# Patient Record
Sex: Male | Born: 1980
Health system: Southern US, Community
[De-identification: ages and names within clinical notes are randomized; demographics above are authoritative.]

## PROBLEM LIST (undated history)

## (undated) DIAGNOSIS — M8580 Other specified disorders of bone density and structure, unspecified site: Secondary | ICD-10-CM

## (undated) DIAGNOSIS — R569 Unspecified convulsions: Secondary | ICD-10-CM

## (undated) DIAGNOSIS — N200 Calculus of kidney: Secondary | ICD-10-CM

## (undated) DIAGNOSIS — F419 Anxiety disorder, unspecified: Secondary | ICD-10-CM

## (undated) HISTORY — DX: Anxiety disorder, unspecified: F41.9

## (undated) HISTORY — DX: Calculus of kidney: N20.0

## (undated) HISTORY — DX: Other specified disorders of bone density and structure, unspecified site: M85.80

## (undated) HISTORY — DX: Unspecified convulsions: R56.9

## (undated) HISTORY — PX: OTHER SURGICAL HISTORY: SHX169

---

## 2001-03-21 ENCOUNTER — Emergency Department (HOSPITAL_COMMUNITY): Admission: EM | Admit: 2001-03-21 | Discharge: 2001-03-21 | Payer: Self-pay | Admitting: Emergency Medicine

## 2001-03-21 ENCOUNTER — Encounter: Payer: Self-pay | Admitting: Emergency Medicine

## 2003-08-28 ENCOUNTER — Emergency Department (HOSPITAL_COMMUNITY): Admission: EM | Admit: 2003-08-28 | Discharge: 2003-08-28 | Payer: Self-pay | Admitting: Emergency Medicine

## 2004-08-09 ENCOUNTER — Emergency Department (HOSPITAL_COMMUNITY): Admission: EM | Admit: 2004-08-09 | Discharge: 2004-08-09 | Payer: Self-pay | Admitting: Emergency Medicine

## 2004-10-30 ENCOUNTER — Encounter: Admission: RE | Admit: 2004-10-30 | Discharge: 2004-10-30 | Payer: Self-pay | Admitting: Neurology
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
## 2010-05-20 ENCOUNTER — Other Ambulatory Visit: Payer: Self-pay | Admitting: Neurology

## 2010-05-20 DIAGNOSIS — Z79899 Other long term (current) drug therapy: Secondary | ICD-10-CM

## 2010-06-16 ENCOUNTER — Other Ambulatory Visit: Payer: Self-pay

## 2012-09-04 ENCOUNTER — Telehealth: Payer: Self-pay | Admitting: Nurse Practitioner

## 2012-09-04 NOTE — Telephone Encounter (Signed)
Spoke with pt to schedule with Dr Hosie Poisson but pt said that Glenn Rosario mentioned to him on his last office visit that she, "had a specific doctor that she was going to refer him to." I am routing this to [employer].

## 2012-11-12 DIAGNOSIS — Z0289 Encounter for other administrative examinations: Secondary | ICD-10-CM

## 2012-11-21 ENCOUNTER — Telehealth: Payer: Self-pay

## 2012-11-21 NOTE — Telephone Encounter (Signed)
I called and notified patient that his [employer] forms have been faxed.

## 2012-12-19 ENCOUNTER — Encounter: Payer: Self-pay | Admitting: Nurse Practitioner

## 2012-12-22 ENCOUNTER — Encounter: Payer: Self-pay | Admitting: Nurse Practitioner

## 2012-12-22 ENCOUNTER — Ambulatory Visit (INDEPENDENT_AMBULATORY_CARE_PROVIDER_SITE_OTHER): Payer: 59 | Admitting: Nurse Practitioner

## 2012-12-22 VITALS — BP 126/86 | HR 78 | Ht 71.0 in | Wt 220.0 lb

## 2012-12-22 DIAGNOSIS — G40309 Generalized idiopathic epilepsy and epileptic syndromes, not intractable, without status epilepticus: Secondary | ICD-10-CM

## 2012-12-22 DIAGNOSIS — Z79899 Other long term (current) drug therapy: Secondary | ICD-10-CM

## 2012-12-22 DIAGNOSIS — G250 Essential tremor: Secondary | ICD-10-CM | POA: Insufficient documentation

## 2012-12-22 MED ORDER — ZONISAMIDE 100 MG PO CAPS: 100.0000 mg | ORAL_CAPSULE | Freq: Two times a day (BID) | ORAL | Status: DC

## 2012-12-22 MED ORDER — LEVETIRACETAM 750 MG PO TABS: ORAL_TABLET | ORAL | Status: DC

## 2012-12-22 NOTE — Patient Instructions (Addendum)
Patient to be assigned to Dr. Hosie Poisson Obtain  CBC and CMP today Biofeedback or other stress reduction activities Melatonin to help with sleep Follow up in 6 months

## 2012-12-22 NOTE — Progress Notes (Signed)
Reason for visit followup for seizure disorder. HPI: [name], 32 year old male returns for followup. He is a previous patient of Dr. Sandria Rosario.  He was last seen 04/21/12. He has a history of primary generalized and myoclonic epilepsy. His seizure disorder began in 1995 and he did well  for many years. At one point he was on Klonopin for increased seizures. DEXA scan done in 2006 showed evidence of osteopenia, he also had difficulty with weight gain and daytime sleepiness and tremor. Keppra added in 2007 and he was tapered off  of Depakote in November and December of 2007. Until January of 2009 he had not had any generalized major motor seizures while being on Keppra. He returns today stating that his seizure activity is fairly well controlled with current meds. Keppra  750 TID and  Zonegran 400mg  daily.   Denies any double vision, or loss of vision, occasionally has ringing in his ears, denies any speech or swallowing problems.  He continues to exercise.    Has tried Klonipin, Dilantin and Tegretol in past with side effects.   2 fractures of the 5th metatarsal of the same foot in 2010  playing  basketball.  Stress and lack of sleep can cause a seizure. No new neurologic complaints.      ROS:  Anxiety, decreased energy, change in appetite, insomnia, seizure  Medications Current Outpatient Prescriptions on File Prior to Visit  Medication Sig Dispense Refill  . Calcium Ascorbate (C-500 NON-ACID) 500 MG TABS Take 500 mg by mouth 3 (three) times daily.      . Cholecalciferol (D 400 PO) Take 400 mg by mouth daily.      Marland Kitchen levETIRAcetam (KEPPRA) 750 MG tablet Take 750 mg by mouth 3 (three) times daily. 1 tab in the morning and 2 tabs at night.      . zonisamide (ZONEGRAN) 100 MG capsule Take 100 mg by mouth 4 (four) times daily.       No current facility-administered medications on file prior to visit.    Allergies  Allergies  Allergen Reactions  . Depakote [Divalproex Sodium]   . Klonopin [Clonazepam]    . Tegretol [Carbamazepine]     Physical Exam General: well developed, well nourished, seated, in no evident distress Head: head normocephalic and atraumatic. Oropharynx benign Neck: supple with no carotid  bruits Cardiovascular: regular rate and rhythm, no murmurs  Neurologic Exam Mental Status: Awake and fully alert. Oriented to place and time. Follows all commands. Speech and language normal.   Cranial Nerves: Fundoscopic exam reveals sharp disc margins. Pupils equal, briskly reactive to light. Extraocular movements full without nystagmus. Visual fields full to confrontation. Hearing intact and symmetric to finger snap. Facial sensation intact. Face, tongue, palate move normally and symmetrically. Neck flexion and extension normal.  Motor: Normal bulk and tone. Normal strength in all tested extremity muscles.No focal weakness Sensory.: intact to touch and pinprick and vibratory.  Coordination: Rapid alternating movements normal in all extremities. Finger-to-nose and heel-to-shin performed accurately bilaterally. Gait and Station: Arises from chair without difficulty. Stance is normal. Gait demonstrates normal stride length and balance . Able to heel, toe and tandem walk without difficulty.  Reflexes: 2+ and symmetric. Toes downgoing.     ASSESSMENT: History of epilepsy and myoclonus doing fairly well on Keppra and Zonegran. Occasional seizure when stressed or lack of sleep. Denies missing any doses of meds     PLAN: Patient to be assigned to Dr. Hosie Poisson Obtain  CBC and CMP today  Biofeedback or other stress reduction activities Melatonin to help with sleep Patient does not want to increase meds or add additional  meds Follow up in 6 months  Glenn Rosario, GNP-BC APRN

## 2012-12-23 ENCOUNTER — Telehealth: Payer: Self-pay

## 2012-12-23 LAB — CBC WITH DIFFERENTIAL/PLATELET
Basophils Absolute: 0 10*3/uL (ref 0.0–0.2)
Basos: 0 % (ref 0–3)
Eos: 3 % (ref 0–5)
Eosinophils Absolute: 0.1 10*3/uL (ref 0.0–0.4)
HCT: 47.8 % (ref 37.5–51.0)
Hemoglobin: 16.8 g/dL (ref 12.6–17.7)
Lymphocytes Absolute: 1.2 10*3/uL (ref 0.7–3.1)
Lymphs: 22 % (ref 14–46)
MCH: 31.3 pg (ref 26.6–33.0)
MCHC: 35.1 g/dL (ref 31.5–35.7)
MCV: 89 fL (ref 79–97)
Monocytes Absolute: 0.5 10*3/uL (ref 0.1–0.9)
Monocytes: 9 % (ref 4–12)
Neutrophils Absolute: 3.5 10*3/uL (ref 1.4–7.0)
Neutrophils Relative %: 66 % (ref 40–74)
RBC: 5.37 x10E6/uL (ref 4.14–5.80)
RDW: 13.4 % (ref 12.3–15.4)
WBC: 5.3 10*3/uL (ref 3.4–10.8)

## 2012-12-23 LAB — COMPREHENSIVE METABOLIC PANEL
Albumin: 4.6 g/dL (ref 3.5–5.5)
Alkaline Phosphatase: 67 IU/L (ref 39–117)
BUN/Creatinine Ratio: 9 (ref 8–19)
BUN: 12 mg/dL (ref 6–20)
Chloride: 103 mmol/L (ref 96–108)
Creatinine, Ser: 1.27 mg/dL (ref 0.76–1.27)
GFR calc Af Amer: 85 mL/min/{1.73_m2} (ref >59)
GFR calc non Af Amer: 73 mL/min/{1.73_m2} (ref >59)
Globulin, Total: 2.5 g/dL (ref 1.5–4.5)
Glucose: 102 mg/dL — ABNORMAL HIGH (ref 65–99)
Total Bilirubin: 0.9 mg/dL (ref 0.0–1.2)
Total Protein: 7.1 g/dL (ref 6.0–8.5)

## 2012-12-23 NOTE — Telephone Encounter (Signed)
Message copied by Dan Humphreys on Tue Dec 23, 2012 12:52 PM ------      Message from: Beverely Low      Created: Tue Dec 23, 2012 10:06 AM       Labs look good. Please call patient            ----- Message -----         From: [facility] Lab Results In Interface         Sent: 12/23/2012   5:44 AM           To: Nilda Riggs, NP                   ------

## 2012-12-23 NOTE — Telephone Encounter (Signed)
Called and left message labs looked good.

## 2012-12-26 ENCOUNTER — Other Ambulatory Visit: Payer: Self-pay

## 2012-12-26 MED ORDER — ZONISAMIDE 100 MG PO CAPS: 200.0000 mg | ORAL_CAPSULE | Freq: Two times a day (BID) | ORAL | Status: DC

## 2013-05-15 DIAGNOSIS — Z0289 Encounter for other administrative examinations: Secondary | ICD-10-CM

## 2013-05-22 ENCOUNTER — Telehealth: Payer: Self-pay

## 2013-05-22 NOTE — Telephone Encounter (Signed)
I called and left VM for patient that forms have been faxed out today.

## 2013-06-24 ENCOUNTER — Ambulatory Visit: Payer: 59 | Admitting: Nurse Practitioner

## 2013-09-07 ENCOUNTER — Encounter: Payer: Self-pay | Admitting: Neurology

## 2013-09-07 ENCOUNTER — Telehealth: Payer: Self-pay | Admitting: Neurology

## 2013-09-07 NOTE — Telephone Encounter (Signed)
Left message for patient regarding rescheduling 10/16/13 appointment per Carolyn's schedule. Printed and sent letter with new appointment date.

## 2013-10-16 ENCOUNTER — Ambulatory Visit: Payer: Self-pay | Admitting: Nurse Practitioner

## 2013-11-18 DIAGNOSIS — Z0289 Encounter for other administrative examinations: Secondary | ICD-10-CM

## 2013-12-21 ENCOUNTER — Encounter: Payer: Self-pay | Admitting: Nurse Practitioner

## 2013-12-21 ENCOUNTER — Ambulatory Visit (INDEPENDENT_AMBULATORY_CARE_PROVIDER_SITE_OTHER): Payer: 59 | Admitting: Nurse Practitioner

## 2013-12-21 VITALS — BP 136/90 | HR 66 | Ht 71.0 in | Wt 226.6 lb

## 2013-12-21 DIAGNOSIS — Z5181 Encounter for therapeutic drug level monitoring: Secondary | ICD-10-CM

## 2013-12-21 DIAGNOSIS — G40309 Generalized idiopathic epilepsy and epileptic syndromes, not intractable, without status epilepticus: Secondary | ICD-10-CM

## 2013-12-21 DIAGNOSIS — G250 Essential tremor: Secondary | ICD-10-CM

## 2013-12-21 DIAGNOSIS — G252 Other specified forms of tremor: Secondary | ICD-10-CM

## 2013-12-21 LAB — COMPREHENSIVE METABOLIC PANEL
ALBUMIN: 4.7 g/dL (ref 3.5–5.5)
ALT: 43 IU/L (ref 0–44)
AST: 20 IU/L (ref 0–40)
Albumin/Globulin Ratio: 1.8 (ref 1.1–2.5)
Alkaline Phosphatase: 83 IU/L (ref 39–117)
BUN / CREAT RATIO: 11 (ref 8–19)
BUN: 15 mg/dL (ref 6–20)
CALCIUM: 9.4 mg/dL (ref 8.7–10.2)
CO2: 24 mmol/L (ref 18–29)
CREATININE: 1.39 mg/dL — AB (ref 0.76–1.27)
Chloride: 102 mmol/L (ref 96–108)
GFR calc Af Amer: 75 mL/min/{1.73_m2} (ref >59)
GFR calc non Af Amer: 65 mL/min/{1.73_m2} (ref >59)
GLOBULIN, TOTAL: 2.6 g/dL (ref 1.5–4.5)
GLUCOSE: 90 mg/dL (ref 65–99)
Potassium: 4.3 mmol/L (ref 3.5–5.2)
Sodium: 140 mmol/L (ref 134–144)
TOTAL PROTEIN: 7.3 g/dL (ref 6.0–8.5)
Total Bilirubin: 0.7 mg/dL (ref 0.0–1.2)

## 2013-12-21 LAB — CBC WITH DIFFERENTIAL
BASOS: 1 %
Basophils Absolute: 0 10*3/uL (ref 0.0–0.2)
EOS ABS: 0.2 10*3/uL (ref 0.0–0.4)
Eos: 3 %
HCT: 46.6 % (ref 37.5–51.0)
HEMOGLOBIN: 16.6 g/dL (ref 12.6–17.7)
LYMPHS: 26 %
Lymphocytes Absolute: 1.7 10*3/uL (ref 0.7–3.1)
MCH: 31.6 pg (ref 26.6–33.0)
MCHC: 35.6 g/dL (ref 31.5–35.7)
MCV: 89 fL (ref 79–97)
Monocytes Absolute: 0.6 10*3/uL (ref 0.1–0.9)
Monocytes: 9 %
NEUTROS PCT: 61 %
Neutrophils Absolute: 4.1 10*3/uL (ref 1.4–7.0)
PLATELETS: 195 10*3/uL (ref 150–379)
RBC: 5.26 x10E6/uL (ref 4.14–5.80)
RDW: 13.6 % (ref 12.3–15.4)
WBC: 6.6 10*3/uL (ref 3.4–10.8)

## 2013-12-21 MED ORDER — ZONISAMIDE 100 MG PO CAPS: 200.0000 mg | ORAL_CAPSULE | Freq: Two times a day (BID) | ORAL | Status: DC

## 2013-12-21 MED ORDER — LEVETIRACETAM 750 MG PO TABS: ORAL_TABLET | ORAL | Status: DC

## 2013-12-21 NOTE — Progress Notes (Signed)
[facility]  PATIENT: Glenn Rosario DOB: 1980/09/21   REASON FOR VISIT: Followup for seizure disorder   HISTORY OF PRESENT ILLNESS:Glenn Rosario, 33 year old male returns for followup. He is history of primary generalized  myoclonic epilepsy. His seizure disorder began in 1995. He is currently on Keppra 750 one tab in the morning and 2 tabs at night along with Zonegran 200 mg twice daily. He has some occasional muscle twitching. He denies any double vision loss of vision speech or swallowing problems. He continues to be very active. Stress and lack of sleep can cause seizure. He returns for reevaluation  HISTORY: Glenn. Rosario, 33 year old male returns for followup. He is a previous patient of Dr. Sandria Manly. He was last seen 04/21/12. He has a history of primary generalized and myoclonic epilepsy. His seizure disorder began in 1995 and he did well for many years. At one point he was on Klonopin for increased seizures. DEXA scan done in 2006 showed evidence of osteopenia, he also had difficulty with weight gain and daytime sleepiness and tremor. Keppra added in 2007 and he was tapered off of Depakote in November and December of 2007. Until January of 2009 he had not had any generalized major motor seizures while being on Keppra. He returns today stating that his seizure activity is fairly well controlled with current meds. Keppra 750 TID and Zonegran  daily. Denies any double vision, or loss of vision, occasionally has ringing in his ears, denies any speech or swallowing problems. He continues to exercise. Has tried Klonipin, Dilantin and Tegretol in past with side effects. 2 fractures of the 5th metatarsal of the same foot in 2010 playing basketball. Stress and lack of sleep can cause a seizure. No new neurologic complaints.    REVIEW OF SYSTEMS: Full 14 system review of systems performed and notable only for those listed, all others are neg:  Constitutional: N/A  Cardiovascular: N/A    Ear/Nose/Throat: N/A  Skin: N/A  Eyes: N/A  Respiratory: N/A  Gastroitestinal: N/A  Hematology/Lymphatic: N/A  Endocrine: N/A Musculoskeletal:N/A  Allergy/Immunology: N/A  Neurological: Seizure disorder  Psychiatric: N/A Sleep : NA   ALLERGIES: Allergies  Allergen Reactions  . Depakote [Divalproex Sodium]   . Klonopin [Clonazepam]   . Tegretol [Carbamazepine]     HOME MEDICATIONS: Outpatient Prescriptions Prior to Visit  Medication Sig Dispense Refill  . Calcium Ascorbate (C-500 NON-ACID) 500 MG TABS Take 500 mg by mouth 3 (three) times daily.      . Cholecalciferol (D 400 PO) Take 400 mg by mouth daily.      Marland Kitchen levETIRAcetam (KEPPRA) 750 MG tablet 1 tab in the morning and 2 tabs at night.  270 tablet  1  . zonisamide (ZONEGRAN) 100 MG capsule Take 2 capsules (200 mg total) by mouth 2 (two) times daily.  360 capsule  1   No facility-administered medications prior to visit.    PAST MEDICAL HISTORY: Past Medical History  Diagnosis Date  . Seizure   . Osteopenia     PAST SURGICAL HISTORY: Past Surgical History  Procedure Laterality Date  . None      FAMILY HISTORY: Family History  Problem Relation Age of Onset  . Healthy Mother   . Healthy Father     SOCIAL HISTORY: History   Social History  . Marital Status: Single    Spouse Name: N/A    Number of Children: 0  . Years of Education: 12+   Occupational History  .  [employer]  Social History Main Topics  . Smoking status: Never Smoker   . Smokeless tobacco: Never Used  . Alcohol Use: Yes     Comment: seldon  . Drug Use: No  . Sexual Activity: Not on file   Other Topics Concern  . Not on file   Social History Narrative   Patient is single and lives alone.    Patient works as an Airline pilot for 8 years.    Patient drinks some caffeine.            PHYSICAL EXAM  Filed Vitals:   12/21/13 0907  BP: 136/90  Pulse: 66  Height:  (1.803 m)  Weight: 226 lb 9.6 oz (102.785 kg)    Body mass index is 31.62 kg/(m^2). General: well developed, well nourished, seated, in no evident distress  Head: head normocephalic and atraumatic. Oropharynx benign  Neck: supple with no carotid bruits  Cardiovascular: regular rate and rhythm, no murmurs  Neurologic Exam  Mental Status: Awake and fully alert. Oriented to place and time. Follows all commands. Speech and language normal.  Cranial Nerves:  Pupils equal, briskly reactive to light. Extraocular movements full without nystagmus. Visual fields full to confrontation. Hearing intact and symmetric to finger snap. Facial sensation intact. Face, tongue, palate move normally and symmetrically. Neck flexion and extension normal.  Motor: Normal bulk and tone. Normal strength in all tested extremity muscles.No focal weakness  Sensory.: intact to touch and pinprick and vibratory.  Coordination: Rapid alternating movements normal in all extremities. Finger-to-nose and heel-to-shin performed accurately bilaterally.  Gait and Station: Arises from chair without difficulty. Stance is normal. Gait demonstrates normal stride length and balance . Able to heel, toe and tandem walk without difficulty.  Reflexes: 2+ and symmetric. Toes downgoing.   DIAGNOSTIC DATA (LABS, IMAGING, TESTING) - I reviewed patient records, labs, notes, testing and imaging myself where available.  Lab Results  Component Value Date   WBC 5.3 12/22/2012   HGB 16.8 12/22/2012   HCT 47.8 12/22/2012   MCV 89 12/22/2012      Component Value Date/Time   NA 139 12/22/2012 0918   K 4.1 12/22/2012 0918   CL 103 12/22/2012 0918   CO2 24 12/22/2012 0918   GLUCOSE 102* 12/22/2012 0918   BUN 12 12/22/2012 0918   CREATININE 1.27 12/22/2012 0918   CALCIUM 9.9 12/22/2012 0918   PROT 7.1 12/22/2012 0918   AST 21 12/22/2012 0918   ALT 41 12/22/2012 0918   ALKPHOS 67 12/22/2012 0918   BILITOT 0.9 12/22/2012 0918   GFRNONAA 73 12/22/2012 0918   GFRAA 85 12/22/2012 0918    ASSESSMENT AND  PLAN  33 y.o. year old male  has a past medical history of of primary generalized  myoclonic epilepsy and Osteopenia. here to followup. Patient has had a few episodes of myoclonus, he is not kept  a  record. Currently compliant on Keppra and Zonegran.  Continue Keppra 750 mg one tab in the morning and 2 at night will refill Continue Zonegran 100 mg, 2 tablets twice daily Labs today Patient to keep a record of his myoclonus events call me within a few weeks. Discussed with Dr. Hosie Poisson and wll increase his Keppra once he calls back Followup yearly and when necessary Nilda Riggs, [facility], [facility], APRN  [facility] [address] [place], [place] [address] [phone]

## 2013-12-21 NOTE — Patient Instructions (Signed)
Continue Keppra 750 mg one tab in the morning and 2 at night will refill Continue Zonegran 100 mg, 2 tablets twice daily Labs today Followup yearly and when necessary

## 2013-12-22 ENCOUNTER — Telehealth: Payer: Self-pay | Admitting: Nurse Practitioner

## 2013-12-22 MED ORDER — LEVETIRACETAM 750 MG PO TABS: 1500.0000 mg | ORAL_TABLET | Freq: Two times a day (BID) | ORAL | Status: DC

## 2013-12-22 NOTE — Telephone Encounter (Signed)
TC to patient at listed work number, number is no longer in service. TC to home number, discussed labs ,  let patient know I want to increase his keppra to 2 tabs twice daily. He is agreeable. Will call mail order to change RX from yesterday.

## 2013-12-22 NOTE — Addendum Note (Signed)
Addended by: Lucille Passy C on: 12/22/2013 03:10 PM   Modules accepted: Orders

## 2013-12-22 NOTE — Telephone Encounter (Signed)
I called [facility].  Spoke with pharmacist Renae Fickle.  Advised Rx dose has changed.  He updated Keppra to  two in am and two in pm.  #360  + 3 refills.

## 2014-11-15 ENCOUNTER — Telehealth: Payer: Self-pay | Admitting: *Deleted

## 2014-11-15 NOTE — Telephone Encounter (Signed)
Form,[employer] sent to Redan[place] and Eber JonesCarolyn 11/15/14.

## 2014-11-16 DIAGNOSIS — Z0289 Encounter for other administrative examinations: Secondary | ICD-10-CM

## 2014-11-16 NOTE — Telephone Encounter (Signed)
Form completed and signed.  To MR. 

## 2014-11-17 NOTE — Telephone Encounter (Signed)
Form,[employer] received,completed by Fernande Brasarolyn and Sandy faxed 11/16/14.

## 2014-11-23 ENCOUNTER — Telehealth: Payer: Self-pay | Admitting: Nurse Practitioner

## 2014-11-23 NOTE — Telephone Encounter (Signed)
Pt called and states that his FMLA paper work needs more information ,per his boss. Needs to know if FMLA paper work is still here. Please call and advise [phone].

## 2014-11-24 ENCOUNTER — Telehealth: Payer: Self-pay | Admitting: *Deleted

## 2014-11-24 NOTE — Telephone Encounter (Signed)
I called pt and will call and speak to Glenn Rosario with [employer], [phone] to see what they need specifically.

## 2014-11-24 NOTE — Telephone Encounter (Signed)
Form went to [facility] 11/24/14.

## 2014-11-25 NOTE — Telephone Encounter (Signed)
I called and spoke to [place], at [employer].  She said for the FMLA to be considered intermittent, the questions # 1 and 6 need to be at least seen 2 times/ yearly.  I will consult with CM/NP and then will call pt back.

## 2014-11-25 NOTE — Telephone Encounter (Signed)
Changes made on FMLA.  To be faxed back to [employer].  Pt aware.  No need to have copy for himself states pt.

## 2014-11-25 NOTE — Telephone Encounter (Signed)
Faxed [phone].  (successful).

## 2014-12-22 ENCOUNTER — Ambulatory Visit: Payer: 59 | Admitting: Nurse Practitioner

## 2014-12-29 ENCOUNTER — Encounter: Payer: Self-pay | Admitting: Nurse Practitioner

## 2014-12-29 ENCOUNTER — Ambulatory Visit (INDEPENDENT_AMBULATORY_CARE_PROVIDER_SITE_OTHER): Payer: 59 | Admitting: Nurse Practitioner

## 2014-12-29 VITALS — BP 130/85 | HR 82 | Ht 71.0 in | Wt 233.2 lb

## 2014-12-29 DIAGNOSIS — G40309 Generalized idiopathic epilepsy and epileptic syndromes, not intractable, without status epilepticus: Secondary | ICD-10-CM | POA: Diagnosis not present

## 2014-12-29 DIAGNOSIS — Z5181 Encounter for therapeutic drug level monitoring: Secondary | ICD-10-CM | POA: Diagnosis not present

## 2014-12-29 DIAGNOSIS — R251 Tremor, unspecified: Secondary | ICD-10-CM | POA: Diagnosis not present

## 2014-12-29 DIAGNOSIS — G250 Essential tremor: Secondary | ICD-10-CM

## 2014-12-29 DIAGNOSIS — G252 Other specified forms of tremor: Secondary | ICD-10-CM

## 2014-12-29 MED ORDER — ZONISAMIDE 100 MG PO CAPS: 200.0000 mg | ORAL_CAPSULE | Freq: Two times a day (BID) | ORAL | Status: DC

## 2014-12-29 MED ORDER — LEVETIRACETAM 750 MG PO TABS: 1500.0000 mg | ORAL_TABLET | Freq: Two times a day (BID) | ORAL | Status: DC

## 2014-12-29 NOTE — Progress Notes (Addendum)
[facility]  PATIENT: Glenn Rosario DOB: [birth year]   REASON FOR VISIT: Follow-up for primary generalized myoclonic epilepsy HISTORY FROM: Patient    HISTORY OF PRESENT ILLNESS:Glenn Rosario, 34 year old male returns for followup. He has a  history of primary generalized myoclonic epilepsy. His seizure disorder began in 1995. He is currently on Keppra 750 two tabs twice daily and Zonegran 200 mg twice daily. He has some rare  muscle twitching. He denies any double vision loss of vision speech or swallowing problems. He continues to be very active. Stress and lack of sleep can cause seizure. He returns for reevaluation. He is a previous patient of Dr. Hosie Poisson who has left the practice  HISTORY: Glenn Rosario, 34 year old male returns for followup. He has a history of primary generalized and myoclonic epilepsy. His seizure disorder began in 1995 and he did well for many years. At one point he was on Klonopin for increased seizures. DEXA scan done in 2006 showed evidence of osteopenia, he also had difficulty with weight gain and daytime sleepiness and tremor. Keppra added in 2007 and he was tapered off of Depakote in November and December of 2007. Until January of 2009 he had not had any generalized major motor seizures while being on Keppra. He returns today stating that his seizure activity is fairly well controlled with current meds. Keppra 750 TID and Zonegran  daily. Denies any double vision, or loss of vision, occasionally has ringing in his ears, denies any speech or swallowing problems. He continues to exercise. Has tried Klonipin, Dilantin and Tegretol in past with side effects. 2 fractures of the 5th metatarsal of the same foot in 2010 playing basketball. Stress and lack of sleep can cause a seizure. No new neurologic complaints.    REVIEW OF SYSTEMS: Full 14 system review of systems performed and notable only for those listed, all others are neg:  Constitutional: neg    Cardiovascular: neg Ear/Nose/Throat: neg  Skin: neg Eyes: neg Respiratory: neg Gastroitestinal: neg  Hematology/Lymphatic: neg  Endocrine: neg Musculoskeletal:neg Allergy/Immunology: neg Neurological: neg Psychiatric: neg Sleep : neg   ALLERGIES: Allergies  Allergen Reactions  . Depakote [Divalproex Sodium]   . Klonopin [Clonazepam]   . Tegretol [Carbamazepine]     HOME MEDICATIONS: Outpatient Prescriptions Prior to Visit  Medication Sig Dispense Refill  . Calcium Ascorbate (C-500 NON-ACID) 500 MG TABS Take 500 mg by mouth 3 (three) times daily.    . Cholecalciferol (D 400 PO) Take 400 mg by mouth daily.    Marland Kitchen levETIRAcetam (KEPPRA) 750 MG tablet Take 2 tablets (1,500 mg total) by mouth 2 (two) times daily. 360 tablet 3  . zonisamide (ZONEGRAN) 100 MG capsule Take 2 capsules (200 mg total) by mouth 2 (two) times daily. 360 capsule 3   No facility-administered medications prior to visit.    PAST MEDICAL HISTORY: Past Medical History  Diagnosis Date  . Seizure   . Osteopenia     PAST SURGICAL HISTORY: Past Surgical History  Procedure Laterality Date  . None      FAMILY HISTORY: Family History  Problem Relation Age of Onset  . Healthy Mother   . Healthy Father     SOCIAL HISTORY: Social History   Social History  . Marital Status: Single    Spouse Name: N/A  . Number of Children: 0  . Years of Education: 12+   Occupational History  .  [employer]   Social History Main Topics  . Smoking status: Never Smoker   .  Smokeless tobacco: Never Used  . Alcohol Use: Yes     Comment: seldon  . Drug Use: No  . Sexual Activity: Not on file   Other Topics Concern  . Not on file   Social History Narrative   Patient is single and lives alone.    Patient works as an Airline pilot for 8 years.    Patient drinks some caffeine.            PHYSICAL EXAM  Filed Vitals:   12/29/14 1539  BP: 130/85  Pulse: 82  Height: 5\' 11"  (1.803 m)  Weight: 233 lb 3.2  oz (105.779 kg)   Body mass index is 32.54 kg/(m^2). General: well developed, well nourished, seated, in no evident distress  Head: head normocephalic and atraumatic. Oropharynx benign  Neck: supple with no carotid bruits  Cardiovascular: regular rate and rhythm, no murmurs  Neurologic Exam  Mental Status: Awake and fully alert. Oriented to place and time. Follows all commands. Speech and language normal.  Cranial Nerves: Pupils equal, briskly reactive to light. Extraocular movements full without nystagmus. Visual fields full to confrontation. Hearing intact and symmetric to finger snap. Facial sensation intact. Face, tongue, palate move normally and symmetrically. Neck flexion and extension normal.  Motor: Normal bulk and tone. Normal strength in all tested extremity muscles.No focal weakness  Sensory.: intact to touch and pinprick and vibratory.  Coordination: Rapid alternating movements normal in all extremities. Finger-to-nose and heel-to-shin performed accurately bilaterally.  Gait and Station: Arises from chair without difficulty. Stance is normal. Gait demonstrates normal stride length and balance . Able to heel, toe and tandem walk without difficulty.  Reflexes: 2+ and symmetric. Toes downgoing.   DIAGNOSTIC DATA (LABS, IMAGING, TESTING) -  ASSESSMENT AND PLAN 34 y.o. year old male has a past medical history of of primary generalized myoclonic epilepsy and Osteopenia. here to followup. Patient has rare episodes of myoclonus, he is not kept a record. Currently compliant on Keppra and Zonegran. The patient is a previous patient of Dr. Hosie Poisson. He will be assigned to Dr. Lucia Gaskins.  Continue Keppra 750 mg 2 tabs twice daily will renew Continue Zonegran 100 mg, 2 tablets twice daily will renew Labs today, CBC, CMP Follow-up in one year Call for any seizure activity Nilda Riggs, [facility], [facility], APRN  [facility] [address], [address] [place],  [place] [address] [phone]   Personally  participated in and made any corrections needed to history, physical, neuro exam,assessment and plan as stated above.  Naomie Dean, MD Stroke Neurology [phone] [facility]

## 2014-12-29 NOTE — Patient Instructions (Addendum)
Continue Keppra 750 mg 2 tabs BID Continue Zonegran 100 mg, 2 tablets twice daily Labs today, CBC, CMP F/U  In 1 year

## 2014-12-30 ENCOUNTER — Telehealth: Payer: Self-pay

## 2014-12-30 LAB — COMPREHENSIVE METABOLIC PANEL
ALK PHOS: 84 IU/L (ref 39–117)
ALT: 38 IU/L (ref 0–44)
AST: 23 IU/L (ref 0–40)
Albumin/Globulin Ratio: 1.6 (ref 1.1–2.5)
Albumin: 4.6 g/dL (ref 3.5–5.5)
BUN/Creatinine Ratio: 12 (ref 8–19)
BUN: 14 mg/dL (ref 6–20)
Bilirubin Total: 0.6 mg/dL (ref 0.0–1.2)
CALCIUM: 9.7 mg/dL (ref 8.7–10.2)
CO2: 20 mmol/L (ref 18–29)
CREATININE: 1.17 mg/dL (ref 0.76–1.27)
Chloride: 104 mmol/L (ref 97–108)
GFR calc Af Amer: 92 mL/min/{1.73_m2} (ref >59)
GFR, EST NON AFRICAN AMERICAN: 80 mL/min/{1.73_m2} (ref >59)
GLOBULIN, TOTAL: 2.8 g/dL (ref 1.5–4.5)
GLUCOSE: 96 mg/dL (ref 65–99)
POTASSIUM: 4 mmol/L (ref 3.5–5.2)
SODIUM: 141 mmol/L (ref 134–144)
Total Protein: 7.4 g/dL (ref 6.0–8.5)

## 2014-12-30 LAB — CBC WITH DIFFERENTIAL/PLATELET
BASOS: 1 %
Basophils Absolute: 0.1 10*3/uL (ref 0.0–0.2)
EOS (ABSOLUTE): 0.2 10*3/uL (ref 0.0–0.4)
Eos: 3 %
Hematocrit: 48.7 % (ref 37.5–51.0)
Hemoglobin: 16.9 g/dL (ref 12.6–17.7)
IMMATURE GRANS (ABS): 0 10*3/uL (ref 0.0–0.1)
IMMATURE GRANULOCYTES: 0 %
LYMPHS: 25 %
Lymphocytes Absolute: 2.1 10*3/uL (ref 0.7–3.1)
MCH: 31.4 pg (ref 26.6–33.0)
MCHC: 34.7 g/dL (ref 31.5–35.7)
MCV: 90 fL (ref 79–97)
MONOS ABS: 0.8 10*3/uL (ref 0.1–0.9)
Monocytes: 9 %
NEUTROS PCT: 62 %
Neutrophils Absolute: 5.4 10*3/uL (ref 1.4–7.0)
PLATELETS: 171 10*3/uL (ref 150–379)
RBC: 5.39 x10E6/uL (ref 4.14–5.80)
RDW: 13.4 % (ref 12.3–15.4)
WBC: 8.5 10*3/uL (ref 3.4–10.8)

## 2014-12-30 NOTE — Telephone Encounter (Signed)
-----   Message from Nilda Riggs, NP sent at 12/30/2014  7:37 AM EDT ----- Labs good please call

## 2014-12-30 NOTE — Telephone Encounter (Signed)
I called the patient and relayed results. 

## 2015-11-08 DIAGNOSIS — Z0289 Encounter for other administrative examinations: Secondary | ICD-10-CM

## 2015-11-09 ENCOUNTER — Telehealth: Payer: Self-pay | Admitting: *Deleted

## 2015-11-09 NOTE — Telephone Encounter (Signed)
Megan-do you want to document what you spoke w/ pt about while in office?

## 2015-11-09 NOTE — Telephone Encounter (Signed)
The patient came to the office today I spoke to the patient. Explained that we would like to examine the patient before submitting FMLA paperwork in case there has been any changes. Patient reports that his paperwork is due July 26 but appointment was scheduled for August 31. We have changed the patient's appointment to Monday, July 17 at 8 AM [facility] Patient is amenable to this plan. Patient's next follow-up will need to occur in June 2018 in order to ensure that his office visit occurs prior to the due date for his FMLA.

## 2015-11-09 NOTE — Telephone Encounter (Signed)
Tried calling pt back. Phone continued to ring, unable to LVM. It pt calls, you can offer earlier appt

## 2015-11-09 NOTE — Telephone Encounter (Signed)
noted 

## 2015-11-09 NOTE — Telephone Encounter (Signed)
I have openings all next visit. Please offer patient earlier appt.

## 2015-11-09 NOTE — Telephone Encounter (Signed)
Eber JonesCarolyn- please advise, are you ok with sending FMLA prior to f/u?   Called pt to advise we did receive FMLA paperwork. I spoke to CM, NP this am and when he comes for f/u on 12/29/15, we will then go ahead and send paperwork. He stated "if that's the case, you guys needs to schedule an earlier f/u than 1 yr so you guys can get this paperwork done in a timely manner. I am not responsible for when this paperwork gets done. I paid 50 dollars for this form and you can't even get it done in a timely manner". I advised pt that Eber JonesCarolyn needs to see him first and do repeat bloodwork, ect prior to sending paperwork.  I advised patient that it typically is our policy for him to pay for form when form is completed. He stated "I went ahead and paid so form could be completed". Per form that [place] Mi[place] E. Took from patient, "patient wanted to go ahead and pay, so he already pain".  I advised I will speak to CM, NP and see if we are able to send prior to f/u on 12/29/15. I advised I was only trying to help and keep him informed. I will call him back and let him know what she says. He did not reply, I said goodbye, and the phone hung up.

## 2015-11-14 ENCOUNTER — Ambulatory Visit (INDEPENDENT_AMBULATORY_CARE_PROVIDER_SITE_OTHER): Payer: 59 | Admitting: Nurse Practitioner

## 2015-11-14 ENCOUNTER — Encounter: Payer: Self-pay | Admitting: Nurse Practitioner

## 2015-11-14 VITALS — BP 124/81 | HR 78 | Ht 71.0 in | Wt 241.6 lb

## 2015-11-14 DIAGNOSIS — Z5181 Encounter for therapeutic drug level monitoring: Secondary | ICD-10-CM | POA: Diagnosis not present

## 2015-11-14 DIAGNOSIS — G250 Essential tremor: Secondary | ICD-10-CM

## 2015-11-14 DIAGNOSIS — G40309 Generalized idiopathic epilepsy and epileptic syndromes, not intractable, without status epilepticus: Secondary | ICD-10-CM

## 2015-11-14 MED ORDER — LEVETIRACETAM 750 MG PO TABS: 1500.0000 mg | ORAL_TABLET | Freq: Two times a day (BID) | ORAL | Status: DC

## 2015-11-14 MED ORDER — ZONISAMIDE 100 MG PO CAPS: 200.0000 mg | ORAL_CAPSULE | Freq: Two times a day (BID) | ORAL | Status: DC

## 2015-11-14 NOTE — Progress Notes (Signed)
I agree with the assessment and plan as directed by NP .The patient is known to me .   Maxie Slovacek, MD  

## 2015-11-14 NOTE — Telephone Encounter (Signed)
FMLA completed, reviewed and signed by CM/NP.  To MR.

## 2015-11-14 NOTE — Progress Notes (Signed)
[facility]  PATIENT: Glenn Rosario DOB: [birth year]   REASON FOR VISIT: Follow-up for generalized convulsive epilepsy, essential tremor HISTORY FROM: Patient    HISTORY OF PRESENT ILLNESS:Glenn Rosario, 35 year old male returns for followup. He has a history of primary generalized myoclonic epilepsy. His seizure disorder began in 1995. He is currently on Keppra 750 two tabs twice daily and Zonegran 200 mg twice daily. He has some rare muscle twitching. He denies any double vision, loss of vision speech or swallowing problems. He continues to be very active. Stress and lack of sleep can cause seizure. He returns for reevaluation.   HISTORY: Glenn Rosario, 35 year old male returns for followup. He has a history of primary generalized and myoclonic epilepsy. His seizure disorder began in 1995 and he did well for many years. At one point he was on Klonopin for increased seizures. DEXA scan done in 2006 showed evidence of osteopenia, he also had difficulty with weight gain and daytime sleepiness and tremor. Keppra added in 2007 and he was tapered off of Depakote in November and December of 2007. Until January of 2009 he had not had any generalized major motor seizures while being on Keppra. He returns today stating that his seizure activity is fairly well controlled with current meds. Keppra 750 TID and Zonegran  daily. Denies any double vision, or loss of vision, occasionally has ringing in his ears, denies any speech or swallowing problems. He continues to exercise. Has tried Klonipin, Dilantin and Tegretol in past with side effects. 2 fractures of the 5th metatarsal of the same foot in 2010 playing basketball. Stress and lack of sleep can cause a seizure. No new neurologic complaints.    REVIEW OF SYSTEMS: Full 14 system review of systems performed and notable only for those listed, all others are neg:  Constitutional: neg  Cardiovascular: neg Ear/Nose/Throat: neg  Skin:  neg Eyes: neg Respiratory: neg Gastroitestinal: neg  Hematology/Lymphatic: neg  Endocrine: neg Musculoskeletal:neg Allergy/Immunology: neg Neurological: neg Psychiatric: neg Sleep : neg   ALLERGIES: Allergies  Allergen Reactions  . Depakote [Divalproex Sodium]   . Klonopin [Clonazepam]   . Tegretol [Carbamazepine]     HOME MEDICATIONS: Outpatient Prescriptions Prior to Visit  Medication Sig Dispense Refill  . Calcium Ascorbate (C-500 NON-ACID) 500 MG TABS Take 500 mg by mouth 3 (three) times daily.    . Cholecalciferol (D 400 PO) Take 400 mg by mouth daily.    Marland Kitchen levETIRAcetam (KEPPRA) 750 MG tablet Take 2 tablets (1,500 mg total) by mouth 2 (two) times daily. 360 tablet 3  . zonisamide (ZONEGRAN) 100 MG capsule Take 2 capsules (200 mg total) by mouth 2 (two) times daily. 360 capsule 3   No facility-administered medications prior to visit.    PAST MEDICAL HISTORY: Past Medical History  Diagnosis Date  . Seizure (HCC)   . Osteopenia     PAST SURGICAL HISTORY: Past Surgical History  Procedure Laterality Date  . None      FAMILY HISTORY: Family History  Problem Relation Age of Onset  . Healthy Mother   . Healthy Father   . Pulmonary Hypertension Mother     SOCIAL HISTORY: Social History   Social History  . Marital Status: Single    Spouse Name: N/A  . Number of Children: 0  . Years of Education: 12+   Occupational History  .  [employer]   Social History Main Topics  . Smoking status: Never Smoker   . Smokeless tobacco: Never Used  .  Alcohol Use: Yes     Comment: seldon  . Drug Use: No  . Sexual Activity: Not on file   Other Topics Concern  . Not on file   Social History Narrative   Patient is single and lives alone.    Patient works as an Airline pilotAccountant for 8 years.    Patient drinks some caffeine.            PHYSICAL EXAM  Filed Vitals:   11/14/15 0755  BP: 124/81  Pulse: 78  Height: 5\' 11"  (1.803 m)  Weight: 241 lb 9.6 oz (109.589  kg)   Body mass index is 33.71 kg/(m^2). General: well developed, well nourished, seated, in no evident distress  Head: head normocephalic and atraumatic. Oropharynx benign  Neck: supple with no carotid bruits  Cardiovascular: regular rate and rhythm, no murmurs  Neurologic Exam  Mental Status: Awake and fully alert. Oriented to place and time. Follows all commands. Speech and language normal.  Cranial Nerves: Pupils equal, briskly reactive to light. Extraocular movements full without nystagmus. Visual fields full to confrontation. Hearing intact and symmetric to finger snap. Facial sensation intact. Face, tongue, palate move normally and symmetrically. Neck flexion and extension normal.  Motor: Normal bulk and tone. Normal strength in all tested extremity muscles.No focal weakness  Coordination: Rapid alternating movements normal in all extremities. Finger-to-nose and heel-to-shin performed accurately bilaterally.  Gait and Station: Arises from chair without difficulty. Stance is normal. Gait demonstrates normal stride length and balance . Able to heel, toe and tandem walk without difficulty.  Reflexes: 2+ and symmetric. Toes downgoing.   DIAGNOSTIC DATA (LABS, IMAGING, TESTING) -  ASSESSMENT AND PLAN 35 y.o. year old male has a past medical history of of primary generalized myoclonic epilepsy and Osteopenia. here to followup. Patient has rare episodes of myoclonus, he is not kept a record. Currently compliant on Keppra and Zonegran. Has failed Klonipin, Dilantin and Tegretol in past with side effects.The patient is a  patient of Dr. Lucia GaskinsAhern who is out of the office.   Continue Keppra 750 mg 2 tabs twice daily will renew Continue Zonegran 100 mg, 2 tablets twice daily will renew Labs today, CBC, CMP Follow-up in one year Call for any seizure activity Nilda RiggsNancy Carolyn Martin, [facility] Hospital[facility]NP, [facility], APRN  [facility] [address], [address] [place], [place] [address] [phone]  [phone]

## 2015-11-14 NOTE — Patient Instructions (Signed)
Continue Keppra 750 mg 2 tabs twice daily will renew Continue Zonegran 100 mg, 2 tablets twice daily will renew Labs today, CBC, CMP Follow-up in one year June of next year Call for any seizure activity

## 2015-11-15 ENCOUNTER — Telehealth: Payer: Self-pay | Admitting: *Deleted

## 2015-11-15 LAB — COMPREHENSIVE METABOLIC PANEL
ALBUMIN: 4.3 g/dL (ref 3.5–5.5)
ALK PHOS: 88 IU/L (ref 39–117)
ALT: 37 IU/L (ref 0–44)
AST: 19 IU/L (ref 0–40)
Albumin/Globulin Ratio: 1.5 (ref 1.2–2.2)
BILIRUBIN TOTAL: 0.6 mg/dL (ref 0.0–1.2)
BUN / CREAT RATIO: 8 — AB (ref 9–20)
BUN: 11 mg/dL (ref 6–20)
CHLORIDE: 103 mmol/L (ref 96–106)
CO2: 20 mmol/L (ref 18–29)
Calcium: 9.3 mg/dL (ref 8.7–10.2)
Creatinine, Ser: 1.46 mg/dL — ABNORMAL HIGH (ref 0.76–1.27)
GFR calc Af Amer: 71 mL/min/{1.73_m2} (ref >59)
GFR calc non Af Amer: 61 mL/min/{1.73_m2} (ref >59)
GLOBULIN, TOTAL: 2.8 g/dL (ref 1.5–4.5)
GLUCOSE: 98 mg/dL (ref 65–99)
Potassium: 4.1 mmol/L (ref 3.5–5.2)
SODIUM: 140 mmol/L (ref 134–144)
Total Protein: 7.1 g/dL (ref 6.0–8.5)

## 2015-11-15 LAB — CBC WITH DIFFERENTIAL/PLATELET
BASOS ABS: 0 10*3/uL (ref 0.0–0.2)
Basos: 1 %
EOS (ABSOLUTE): 0.2 10*3/uL (ref 0.0–0.4)
Eos: 4 %
HEMATOCRIT: 48 % (ref 37.5–51.0)
HEMOGLOBIN: 16.5 g/dL (ref 12.6–17.7)
IMMATURE GRANULOCYTES: 1 %
Immature Grans (Abs): 0 10*3/uL (ref 0.0–0.1)
LYMPHS ABS: 1.9 10*3/uL (ref 0.7–3.1)
Lymphs: 29 %
MCH: 31 pg (ref 26.6–33.0)
MCHC: 34.4 g/dL (ref 31.5–35.7)
MCV: 90 fL (ref 79–97)
MONOS ABS: 0.6 10*3/uL (ref 0.1–0.9)
Monocytes: 10 %
NEUTROS PCT: 55 %
Neutrophils Absolute: 3.6 10*3/uL (ref 1.4–7.0)
Platelets: 187 10*3/uL (ref 150–379)
RBC: 5.32 x10E6/uL (ref 4.14–5.80)
RDW: 13.5 % (ref 12.3–15.4)
WBC: 6.3 10*3/uL (ref 3.4–10.8)

## 2015-11-15 NOTE — Telephone Encounter (Signed)
-----   Message from Nilda RiggsNancy Carolyn Martin, NP sent at 11/15/2015  8:00 AM EDT ----- Cr  elevated otherwise normal.

## 2015-11-15 NOTE — Telephone Encounter (Signed)
Spoke to pt and relayed lab results.  Cr elevated otherwise normal.  No other labs needed.  He will contact us if wants for his pcp.

## 2015-11-16 ENCOUNTER — Telehealth: Payer: Self-pay | Admitting: *Deleted

## 2015-11-16 NOTE — Telephone Encounter (Signed)
[facility]16/2015.

## 2015-12-29 ENCOUNTER — Ambulatory Visit: Payer: 59 | Admitting: Nurse Practitioner

## 2016-10-15 ENCOUNTER — Ambulatory Visit (INDEPENDENT_AMBULATORY_CARE_PROVIDER_SITE_OTHER): Payer: 59 | Admitting: Nurse Practitioner

## 2016-10-15 ENCOUNTER — Encounter: Payer: Self-pay | Admitting: Nurse Practitioner

## 2016-10-15 VITALS — BP 134/84 | HR 76 | Ht 71.0 in | Wt 232.6 lb

## 2016-10-15 DIAGNOSIS — G40309 Generalized idiopathic epilepsy and epileptic syndromes, not intractable, without status epilepticus: Secondary | ICD-10-CM

## 2016-10-15 DIAGNOSIS — Z5181 Encounter for therapeutic drug level monitoring: Secondary | ICD-10-CM

## 2016-10-15 DIAGNOSIS — G250 Essential tremor: Secondary | ICD-10-CM | POA: Diagnosis not present

## 2016-10-15 MED ORDER — ZONISAMIDE 100 MG PO CAPS: 200.0000 mg | ORAL_CAPSULE | Freq: Two times a day (BID) | ORAL | 3 refills | Status: DC

## 2016-10-15 MED ORDER — LEVETIRACETAM 750 MG PO TABS: 1500.0000 mg | ORAL_TABLET | Freq: Two times a day (BID) | ORAL | 3 refills | Status: DC

## 2016-10-15 NOTE — Patient Instructions (Signed)
Continue Keppra 750 mg 2 tabs twice daily will renew Continue Zonegran 100 mg, 2 tablets twice daily will renew Labs today, CBC, CMP Follow-up in one year Call for any seizure activity

## 2016-10-15 NOTE — Progress Notes (Signed)
[facility]  PATIENT: Glenn Rosario DOB: [birth year]   REASON FOR VISIT: Follow-up for generalized convulsive epilepsy, essential tremor HISTORY FROM: Patient    HISTORY OF PRESENT ILLNESS:UPDATE  10/15/2016 Glenn Rosario, 36 year old male returns for yearly followup. He has a history of primary generalized myoclonic epilepsy. His seizure disorder began in 1995. He is currently on Keppra 750 two tabs twice daily and Zonegran 200 mg twice daily. He has some rare muscle twitching. He denies any double vision, loss of vision speech or swallowing problems. He continues to be very active. Stress and lack of sleep can cause seizure.Has tried Klonipin, Dilantin and Tegretol in past with side effects He returns for reevaluation.   HISTORY: Glenn. Denny Rosario, 36 year old male returns for followup. He has a history of primary generalized and myoclonic epilepsy. His seizure disorder began in 1995 and he did well for many years. At one point he was on Klonopin for increased seizures. DEXA scan done in 2006 showed evidence of osteopenia, he also had difficulty with weight gain and daytime sleepiness and tremor. Keppra added in 2007 and he was tapered off of Depakote in November and December of 2007. Until January of 2009 he had not had any generalized major motor seizures while being on Keppra. He returns today stating that his seizure activity is fairly well controlled with current meds. Keppra 750 TID and Zonegran 400mg  daily. Denies any double vision, or loss of vision, occasionally has ringing in his ears, denies any speech or swallowing problems. He continues to exercise. Has tried Klonipin, Dilantin and Tegretol in past with side effects. 2 fractures of the 5th metatarsal of the same foot in 2010 playing basketball. Stress and lack of sleep can cause a seizure. No new neurologic complaints.    REVIEW OF SYSTEMS: Full 14 system review of systems performed and notable only for those listed, all  others are neg:  Constitutional: neg  Cardiovascular: neg Ear/Nose/Throat: neg  Skin: neg Eyes: neg Respiratory: neg Gastroitestinal: neg  Hematology/Lymphatic: neg  Endocrine: neg Musculoskeletal:neg Allergy/Immunology: neg Neurological: neg Psychiatric: neg Sleep : neg   ALLERGIES: Allergies  Allergen Reactions  . Depakote [Divalproex Sodium]   . Klonopin [Clonazepam]   . Tegretol [Carbamazepine]     HOME MEDICATIONS: Outpatient Medications Prior to Visit  Medication Sig Dispense Refill  . Calcium Ascorbate (C-500 NON-ACID) 500 MG TABS Take 500 mg by mouth 3 (three) times daily.    . Cholecalciferol (D 400 PO) Take 400 mg by mouth daily.    Marland Kitchen. levETIRAcetam (KEPPRA) 750 MG tablet Take 2 tablets (1,500 mg total) by mouth 2 (two) times daily. 360 tablet 3  . zonisamide (ZONEGRAN) 100 MG capsule Take 2 capsules (200 mg total) by mouth 2 (two) times daily. 360 capsule 3   No facility-administered medications prior to visit.     PAST MEDICAL HISTORY: Past Medical History:  Diagnosis Date  . Osteopenia   . Seizure (HCC)     PAST SURGICAL HISTORY: Past Surgical History:  Procedure Laterality Date  . None      FAMILY HISTORY: Family History  Problem Relation Age of Onset  . Healthy Mother   . Pulmonary Hypertension Mother   . Healthy Father     SOCIAL HISTORY: Social History   Social History  . Marital status: Single    Spouse name: N/A  . Number of children: 0  . Years of education: 12+   Occupational History  .  [employer]   Social History Main  Topics  . Smoking status: Never Smoker  . Smokeless tobacco: Never Used  . Alcohol use Yes     Comment: seldon  . Drug use: No  . Sexual activity: Not on file   Other Topics Concern  . Not on file   Social History Narrative   Patient is single and lives alone.    Patient works as an Airline pilot for 8 years.    Patient drinks some caffeine.            PHYSICAL EXAM  Vitals:   10/15/16 0806    BP: 134/84  Pulse: 76  Weight: 232 lb 9.6 oz (105.5 kg)  Height: 5\' 11"  (1.803 m)   Body mass index is 32.44 kg/m. General: well developed, well nourished, seated, in no evident distress  Head: head normocephalic and atraumatic. Oropharynx benign  Neck: supple with no carotid bruits  Neurologic Exam  Mental Status: Awake and fully alert. Oriented to place and time. Follows all commands. Speech and language normal.  Cranial Nerves: Pupils equal, briskly reactive to light. Extraocular movements full without nystagmus. Visual fields full to confrontation. Hearing intact and symmetric to finger snap. Facial sensation intact. Face, tongue, palate move normally and symmetrically. Neck flexion and extension normal.  Motor: Normal bulk and tone. Normal strength in all tested extremity muscles.No focal weakness  Coordination: Rapid alternating movements normal in all extremities. Finger-to-nose and heel-to-shin performed accurately bilaterally.  Gait and Station: Arises from chair without difficulty. Stance is normal. Gait demonstrates normal stride length and balance . Able to heel, toe and tandem walk without difficulty.  Reflexes: 2+ and symmetric. Toes downgoing.   DIAGNOSTIC DATA (LABS, IMAGING, TESTING) -  ASSESSMENT AND PLAN 36y.o. year old male has a past medical history of of primary generalized myoclonic epilepsy and Osteopenia. here to followup. Patient has rare episodes of myoclonus, he is not kept a record. Currently compliant on Keppra and Zonegran. Has failed Klonipin, Dilantin and Tegretol in past with side effects.   Continue Keppra 750 mg 2 tabs twice daily will renew Continue Zonegran 100 mg, 2 tablets twice daily will renew Labs today, CBC, CMP to monitor adverse effects seizure medications Follow-up in one year next with Dr. Lucia Gaskins Call for any seizure activity Nilda Riggs, [facility], [facility], APRN  [facility] [address] [place], [place] [address] [phone]

## 2016-10-16 ENCOUNTER — Telehealth: Payer: Self-pay | Admitting: *Deleted

## 2016-10-16 LAB — CBC WITH DIFFERENTIAL/PLATELET
Basophils Absolute: 0 10*3/uL (ref 0.0–0.2)
Basos: 1 %
EOS (ABSOLUTE): 0.1 10*3/uL (ref 0.0–0.4)
Eos: 2 %
Hematocrit: 50.3 % (ref 37.5–51.0)
Hemoglobin: 16.5 g/dL (ref 13.0–17.7)
IMMATURE GRANULOCYTES: 0 %
Immature Grans (Abs): 0 10*3/uL (ref 0.0–0.1)
LYMPHS ABS: 1.4 10*3/uL (ref 0.7–3.1)
Lymphs: 24 %
MCH: 30.6 pg (ref 26.6–33.0)
MCHC: 32.8 g/dL (ref 31.5–35.7)
MCV: 93 fL (ref 79–97)
MONOS ABS: 0.6 10*3/uL (ref 0.1–0.9)
Monocytes: 10 %
NEUTROS PCT: 63 %
Neutrophils Absolute: 3.6 10*3/uL (ref 1.4–7.0)
PLATELETS: 172 10*3/uL (ref 150–379)
RBC: 5.39 x10E6/uL (ref 4.14–5.80)
RDW: 13.2 % (ref 12.3–15.4)
WBC: 5.7 10*3/uL (ref 3.4–10.8)

## 2016-10-16 LAB — COMPREHENSIVE METABOLIC PANEL
A/G RATIO: 2 (ref 1.2–2.2)
ALBUMIN: 4.7 g/dL (ref 3.5–5.5)
ALK PHOS: 73 IU/L (ref 39–117)
ALT: 31 IU/L (ref 0–44)
AST: 19 IU/L (ref 0–40)
BUN / CREAT RATIO: 8 — AB (ref 9–20)
BUN: 11 mg/dL (ref 6–20)
Bilirubin Total: 0.6 mg/dL (ref 0.0–1.2)
CALCIUM: 9.5 mg/dL (ref 8.7–10.2)
CO2: 21 mmol/L (ref 20–29)
Chloride: 103 mmol/L (ref 96–106)
Creatinine, Ser: 1.41 mg/dL — ABNORMAL HIGH (ref 0.76–1.27)
GFR calc Af Amer: 73 mL/min/{1.73_m2} (ref >59)
GFR, EST NON AFRICAN AMERICAN: 63 mL/min/{1.73_m2} (ref >59)
GLOBULIN, TOTAL: 2.3 g/dL (ref 1.5–4.5)
Glucose: 104 mg/dL — ABNORMAL HIGH (ref 65–99)
POTASSIUM: 4.4 mmol/L (ref 3.5–5.2)
SODIUM: 142 mmol/L (ref 134–144)
Total Protein: 7 g/dL (ref 6.0–8.5)

## 2016-10-16 NOTE — Telephone Encounter (Signed)
Attempted to reach patient re: lab results. Phone continuously rang.

## 2016-10-17 NOTE — Telephone Encounter (Signed)
Called pt on home #. NO answer.  Will try later.

## 2016-10-18 ENCOUNTER — Encounter: Payer: Self-pay | Admitting: *Deleted

## 2016-10-18 NOTE — Telephone Encounter (Signed)
Not able to reach pt.  No pcp listed.  I mailed copy of results to pts address and relayed that to f/u with pcp.  He is to call back if questions.

## 2016-11-10 NOTE — Progress Notes (Signed)
Personally  participated in, made any corrections needed, and agree with history, physical, neuro exam,assessment and plan as stated.     Antonia Ahern, MD [facility]     

## 2016-11-19 ENCOUNTER — Telehealth: Payer: Self-pay

## 2016-11-19 NOTE — Telephone Encounter (Signed)
FMLA paperwork completed, reviewed/signed by Darrol Angelarolyn Martin NP allowing pt a reduced schedule and/or a day off 2x/month for seizure/recovery and time for yearly follow-up appts. Returned to medical records for payment/processing.

## 2016-11-21 DIAGNOSIS — Z0289 Encounter for other administrative examinations: Secondary | ICD-10-CM

## 2016-11-23 NOTE — Telephone Encounter (Signed)
Completed and re faxed

## 2016-11-23 NOTE — Telephone Encounter (Signed)
Pt called said [facility] they have rec'd the forms but a change needs to be made: #7 : pls add - as needed Pls refax  Thank you

## 2017-10-21 ENCOUNTER — Ambulatory Visit (INDEPENDENT_AMBULATORY_CARE_PROVIDER_SITE_OTHER): Payer: 59 | Admitting: Neurology

## 2017-10-21 ENCOUNTER — Encounter: Payer: Self-pay | Admitting: Neurology

## 2017-10-21 VITALS — BP 128/84 | HR 88 | Ht 71.0 in | Wt 243.0 lb

## 2017-10-21 DIAGNOSIS — G40B09 Juvenile myoclonic epilepsy, not intractable, without status epilepticus: Secondary | ICD-10-CM

## 2017-10-21 DIAGNOSIS — G40409 Other generalized epilepsy and epileptic syndromes, not intractable, without status epilepticus: Secondary | ICD-10-CM | POA: Diagnosis not present

## 2017-10-21 MED ORDER — ZONISAMIDE 100 MG PO CAPS: 200.0000 mg | ORAL_CAPSULE | Freq: Two times a day (BID) | ORAL | 3 refills | Status: DC

## 2017-10-21 MED ORDER — LEVETIRACETAM 750 MG PO TABS: 1500.0000 mg | ORAL_TABLET | Freq: Two times a day (BID) | ORAL | 3 refills | Status: DC

## 2017-10-21 NOTE — Progress Notes (Signed)
[facility]    Provider:  Dr Lucia Gaskins Referring Provider: No ref. provider found Primary Care Physician:  Patient, No Pcp Per  CC:  Myoclonic epilepsy, JME  HPI:  Glenn Rosario is a 37 y.o. male here as a referral from Dr. No ref. provider found for epilepsy.  I am seeing patient for the first time today.  He has generalized convulsive epilepsy and essential tremor.  Per review of notes he has a history of primary generalized myoclonic epilepsy.  His seizure disorder began in 1995.  He currently takes Keppra 1500 mg twice daily and Zonegran 200 mg twice daily.  He has some rare muscle twitching.  He denies any double vision, loss of vision speech or swallowing problems.  In the past stress and lack of sleep can cause seizures.  Has tried Klonopin, Dilantin and Tegretol in the past with side effects.  DEXA scan done in 2006 showed evidence of osteopenia.  He also had difficulty with weight gain and daytime sleepiness and tremor.  Keppra was added in 2007 and he was tapered off Depakote.  He did not have seizures for 2 years until January 2009 at that time it appears Zonegran was possibly added. No seizures since last being seen. Well controlled on medication. Compliant.   Meds tried: Dilantin, Tegretol, Depakote, Klonopin on Keppra and zonisamide.  Reviewed notes, labs and imaging from outside physicians, which showed: see above  CT head 2002 which was normal by report  Review of Systems: Patient complains of symptoms per HPI as well as the following symptoms: no CP or SOB, no seizures. Pertinent negatives and positives per HPI. All others negative.   Social History   Socioeconomic History  . Marital status: Single    Spouse name: Not on file  . Number of children: 0  . Years of education: 12+  . Highest education level: Not on file  Occupational History    Employer: [employer]  Social Needs  . Financial resource strain: Not on file  . Food insecurity:    Worry: Not  on file    Inability: Not on file  . Transportation needs:    Medical: Not on file    Non-medical: Not on file  Tobacco Use  . Smoking status: Never Smoker  . Smokeless tobacco: Never Used  Substance and Sexual Activity  . Alcohol use: Yes    Alcohol/week: 3.0 oz    Types: 5 Standard drinks or equivalent per week  . Drug use: No  . Sexual activity: Not on file  Lifestyle  . Physical activity:    Days per week: Not on file    Minutes per session: Not on file  . Stress: Not on file  Relationships  . Social connections:    Talks on phone: Not on file    Gets together: Not on file    Attends religious service: Not on file    Active member of club or organization: Not on file    Attends meetings of clubs or organizations: Not on file    Relationship status: Not on file  . Intimate partner violence:    Fear of current or ex partner: Not on file    Emotionally abused: Not on file    Physically abused: Not on file    Forced sexual activity: Not on file  Other Topics Concern  . Not on file  Social History Narrative   Patient is single and lives alone.    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    Patient drinks some caffeine.        Family History  Problem Relation Age of Onset  . Healthy Mother   . Pulmonary Hypertension Mother   . Healthy Father     Past Medical History:  Diagnosis Date  . Osteopenia   . Seizure [facility](HCC)     Past Surgical History:  Procedure Laterality Date  . None      Current Outpatient Medications  Medication Sig Dispense Refill  . Calcium Ascorbate (C-500 NON-ACID) 500 MG TABS Take 500 mg by mouth daily.     . Cholecalciferol (D 400 PO) Take 400 mg by mouth daily.    Marland Kitchen. levETIRAcetam (KEPPRA) 750 MG tablet Take 2 tablets (1,500 mg total) by mouth 2 (two) times daily. 360 tablet 3  . zonisamide (ZONEGRAN) 100 MG capsule Take 2 capsules (200 mg total) by mouth 2 (two) times daily. 360 capsule 3   No current facility-administered  medications for this visit.     Allergies as of 10/21/2017 - Review Complete 10/21/2017  Allergen Reaction Noted  . Depakote [divalproex sodium]  12/19/2012  . Klonopin [clonazepam]  12/19/2012  . Tegretol [carbamazepine]  12/19/2012    Vitals: BP 128/84 (BP Location: Right Arm, Patient Position: Sitting) Comment: automatic  Pulse 88   Ht 5\' 11"  (1.803 m)   Wt 243 lb (110.2 kg)   BMI 33.89 kg/m  Last Weight:  Wt Readings from Last 1 Encounters:  10/21/17 243 lb (110.2 kg)   Last Height:   Ht Readings from Last 1 Encounters:  10/21/17 5\' 11"  (1.803 m)   Physical exam: Exam: Gen: NAD, conversant, well nourised, obese, well groomed                     CV: RRR, no MRG. No Carotid Bruits. No peripheral edema, warm, nontender Eyes: Conjunctivae clear without exudates or hemorrhage  Neuro: Detailed Neurologic Exam  Speech:    Speech is normal; fluent and spontaneous with normal comprehension.  Cognition:    The patient is oriented to person, place, and time;     recent and remote memory intact;     language fluent;     normal attention, concentration,     fund of knowledge Cranial Nerves:    The pupils are equal, round, and reactive to light. The fundi are normal and spontaneous venous pulsations are present. Visual fields are full to finger confrontation. Extraocular movements are intact. Trigeminal sensation is intact and the muscles of mastication are normal. The face is symmetric. The palate elevates in the midline. Hearing intact. Voice is normal. Shoulder shrug is normal. The tongue has normal motion without fasciculations.   Coordination:    Normal finger to nose and heel to shin. Normal rapid alternating movements.   Gait:    Heel-toe and tandem gait are normal.   Motor Observation:    No asymmetry, no atrophy, and no involuntary movements noted. Tone:    Normal muscle tone.    Posture:    Posture is normal. normal erect    Strength:    Strength is V/V in  the upper and lower limbs.      Sensation: intact to LT     Reflex Exam:  DTR's:    Deep tendon reflexes in the upper and lower extremities are normal bilaterally.   Toes:    The toes are downgoing bilaterally.   Clonus:    Clonus is absent.  Assessment/Plan: 37 year old male with a past medical history of primary generalized myoclonic epilepsy and osteopenia.  He is tried multiple medications and is doing well on Keppra and zonisamide.  Continue Keppra 1500 mg twice daily Continue Zonegran 400 mg daily Refill meds We will check labs today Needs follow-up with primary care for repeat DEXA scan  Discussed Patients with epilepsy have a small risk of sudden unexpected death, a condition referred to as sudden unexpected death in epilepsy (SUDEP). SUDEP is defined specifically as the sudden, unexpected, witnessed or unwitnessed, nontraumatic and nondrowning death in patients with epilepsy with or without evidence for a seizure, and excluding documented status epilepticus, in which post mortem examination does not reveal a structural or toxicologic cause for death   Orders Placed This Encounter  Procedures  . CBC  . Comprehensive metabolic panel  . TSH    Naomie Dean, MD  [facility] [address] [place], [place] [address]  Phone [phone] Fax [phone]

## 2017-10-21 NOTE — Patient Instructions (Signed)
Epilepsy °Epilepsy is a condition in which a person has repeated seizures over time. A seizure is a sudden burst of abnormal electrical and chemical activity in the brain. Seizures can cause a change in attention, behavior, or the ability to remain awake and alert (altered mental status). °Epilepsy increases a person's risk of falls, accidents, and injury. It can also lead to complications, including: °· Depression. °· Poor memory. °· Sudden unexplained death in epilepsy (SUDEP). This complication is rare, and its cause is not known. ° °Most people with epilepsy lead normal lives. °What are the causes? °This condition may be caused by: °· A head injury. °· An injury that happens at birth. °· A high fever during childhood. °· A stroke. °· Bleeding that goes into or around the brain. °· Certain medicines and drugs. °· Having too little oxygen for a long period of time. °· Abnormal brain development. °· Certain infections, such as meningitis and encephalitis. °· Brain tumors. °· Conditions that are passed along from parent to child (are hereditary). ° °What are the signs or symptoms? °Symptoms of a seizure vary greatly from person to person. They include: °· Convulsions. °· Stiffening of the body. °· Involuntary movements of the arms or legs. °· Loss of consciousness. °· Breathing problems. °· Falling suddenly. °· Confusion. °· Head nodding. °· Eye blinking or fluttering. °· Lip smacking. °· Drooling. °· Rapid eye movements. °· Grunting. °· Loss of bladder control and bowel control. °· Staring. °· Unresponsiveness. ° °Some people have symptoms right before a seizure happens (aura) and right after a seizure happens. Symptoms of an aura include: °· Fear or anxiety. °· Nausea. °· Feeling like the room is spinning (vertigo). °· A feeling of having seen or heard something before (deja vu). °· Odd tastes or smells. °· Changes in vision, such as seeing flashing lights or spots. ° °Symptoms that follow a seizure  include: °· Confusion. °· Sleepiness. °· Headache. ° °How is this diagnosed? °This condition is diagnosed based on: °· Your symptoms. °· Your medical history. °· A physical exam. °· A neurological exam. A neurological exam is similar to a physical exam. It involves checking your strength, reflexes, coordination, and sensations. °· Tests, such as: °? An electroencephalogram (EEG). This is a painless test that creates a diagram of your brain waves. °? An MRI of the brain. °? A CT scan of the brain. °? A lumbar puncture, also called a spinal tap. °? Blood tests to check for signs of infection or abnormal blood chemistry. ° °How is this treated? °There is no cure for this condition, but treatment can help control seizures. Treatment may involve: °· Taking medicines to control seizures. These include medicines to prevent seizures and medicines to stop seizures as they occur. °· Having a device called a vagus nerve stimulator implanted in the chest. The device sends electrical impulses to the vagus nerve and to the brain to prevent seizures. This treatment may be recommended if medicines do not help. °· Brain surgery. There are several kinds of surgeries that may be done to stop seizures from happening or to reduce how often seizures happen. °· Having regular blood tests. You may need to have blood tests regularly to check that you are getting the right amount of medicine. ° °Once this condition has been diagnosed, it is important to begin treatment as soon as possible. For some people, epilepsy eventually goes away. °Follow these instructions at home: °Medicines ° °· Take over-the-counter and prescription medicines only as   told by your health care provider. °· Avoid any substances that may prevent your medicine from working properly, such as alcohol. °Activity °· Get enough rest. Lack of sleep can make seizures more likely to occur. °· Follow instructions from your health care provider about driving, swimming, and doing  any other activities that would be dangerous if you had a seizure. °Educating others °Teach friends and family what to do if you have a seizure. They should: °· Lay you on the ground to prevent a fall. °· Cushion your head and body. °· Loosen any tight clothing around your neck. °· Turn you on your side. If vomiting occurs, this helps keep your airway clear. °· Stay with you until you recover. °· Not hold you down. Holding you down will not stop the seizure. °· Not put anything in your mouth. °· Know whether or not you need emergency care. ° °General instructions °· Avoid anything that has ever triggered a seizure for you. °· Keep a seizure diary. Record what you remember about each seizure, especially anything that might have triggered the seizure. °· Keep all follow-up visits as told by your health care provider. This is important. °Contact a health care provider if: °· Your seizure pattern changes. °· You have symptoms of infection or another illness. This might increase your risk of having a seizure. °Get help right away if: °· You have a seizure that does not stop after 5 minutes. °· You have several seizures in a row without a complete recovery in between seizures. °· You have a seizure that makes it harder to breathe. °· You have a seizure that is different from previous seizures. °· You have a seizure that leaves you unable to speak or use a part of your body. °· You did not wake up immediately after a seizure. °This information is not intended to replace advice given to you by your health care provider. Make sure you discuss any questions you have with your health care provider. °Document Released: 04/16/2005 Document Revised: 11/12/2015 Document Reviewed: 10/25/2015 °Elsevier Interactive Patient Education © 2018 [employer] ° °

## 2017-10-22 LAB — COMPREHENSIVE METABOLIC PANEL
ALK PHOS: 75 IU/L (ref 39–117)
ALT: 42 IU/L (ref 0–44)
AST: 22 IU/L (ref 0–40)
Albumin/Globulin Ratio: 1.7 (ref 1.2–2.2)
Albumin: 4.4 g/dL (ref 3.5–5.5)
BUN/Creatinine Ratio: 10 (ref 9–20)
BUN: 13 mg/dL (ref 6–20)
Bilirubin Total: 0.6 mg/dL (ref 0.0–1.2)
CALCIUM: 9.5 mg/dL (ref 8.7–10.2)
CO2: 20 mmol/L (ref 20–29)
Chloride: 105 mmol/L (ref 96–106)
Creatinine, Ser: 1.33 mg/dL — ABNORMAL HIGH (ref 0.76–1.27)
GFR calc Af Amer: 78 mL/min/{1.73_m2} (ref >59)
GFR, EST NON AFRICAN AMERICAN: 67 mL/min/{1.73_m2} (ref >59)
GLUCOSE: 90 mg/dL (ref 65–99)
Globulin, Total: 2.6 g/dL (ref 1.5–4.5)
POTASSIUM: 4.2 mmol/L (ref 3.5–5.2)
SODIUM: 140 mmol/L (ref 134–144)
Total Protein: 7 g/dL (ref 6.0–8.5)

## 2017-10-22 LAB — CBC
HEMOGLOBIN: 16.8 g/dL (ref 13.0–17.7)
Hematocrit: 50.9 % (ref 37.5–51.0)
MCH: 30.7 pg (ref 26.6–33.0)
MCHC: 33 g/dL (ref 31.5–35.7)
MCV: 93 fL (ref 79–97)
RBC: 5.47 x10E6/uL (ref 4.14–5.80)
RDW: 13 % (ref 12.3–15.4)
WBC: 6.9 10*3/uL (ref 3.4–10.8)

## 2017-10-22 LAB — TSH: TSH: 1.28 u[IU]/mL (ref 0.450–4.500)

## 2017-10-23 ENCOUNTER — Telehealth: Payer: Self-pay | Admitting: *Deleted

## 2017-10-23 NOTE — Telephone Encounter (Addendum)
Called pt and informed him that his kidney function is abnormal. He verbalized understanding of this and stated he already knew it was slightly elevated. Informed pt that Dr. Lucia GaskinsAhern wants him to see PCP for evaluation and follow up and he verbalized understanding. He had no questions.    ----- Message from Anson FretAntonia B Ahern, MD sent at 10/22/2017  5:54 PM EDT ----- Patient's kidney function is abnormal. He needs to see a primary care physician for evaluation of kidney disease. thanks

## 2017-11-20 ENCOUNTER — Telehealth: Payer: Self-pay | Admitting: Neurology

## 2017-11-20 ENCOUNTER — Telehealth: Payer: Self-pay | Admitting: *Deleted

## 2017-11-20 NOTE — Telephone Encounter (Addendum)
Pt fmla form faxed on 11/21/17 to [employer] [phone]

## 2017-11-20 NOTE — Telephone Encounter (Signed)
Pt FMLA form fax over today. I will call pt for payment.

## 2017-11-20 NOTE — Telephone Encounter (Signed)
Pt called wanting to inform Dr. Lucia GaskinsAhern that her faxed over his yearly FMLA paperwork to medical records. FYI

## 2017-11-21 DIAGNOSIS — Z0289 Encounter for other administrative examinations: Secondary | ICD-10-CM

## 2017-11-21 NOTE — Telephone Encounter (Signed)
Signed.  To MR, Stanton KidneyDebra.

## 2018-10-20 ENCOUNTER — Telehealth: Payer: Self-pay

## 2018-10-20 NOTE — Telephone Encounter (Signed)
Patient has agreed to come and see Amy for a in office visit on 10/22/2018 @ 8:00 am. Patient is aware of new date and time

## 2018-10-22 ENCOUNTER — Ambulatory Visit (INDEPENDENT_AMBULATORY_CARE_PROVIDER_SITE_OTHER): Payer: 59 | Admitting: Family Medicine

## 2018-10-22 ENCOUNTER — Other Ambulatory Visit: Payer: Self-pay

## 2018-10-22 ENCOUNTER — Encounter: Payer: Self-pay | Admitting: Family Medicine

## 2018-10-22 VITALS — BP 125/83 | HR 80 | Temp 97.7°F | Ht 71.0 in | Wt 252.8 lb

## 2018-10-22 DIAGNOSIS — G40309 Generalized idiopathic epilepsy and epileptic syndromes, not intractable, without status epilepticus: Secondary | ICD-10-CM | POA: Diagnosis not present

## 2018-10-22 DIAGNOSIS — G250 Essential tremor: Secondary | ICD-10-CM

## 2018-10-22 NOTE — Progress Notes (Signed)
Made any corrections needed, and agree with history, physical, neuro exam,assessment and plan as stated.     Coretha Creswell, MD [facility]  

## 2018-10-22 NOTE — Progress Notes (Signed)
PATIENT: Glenn Rosario DOB: 1981-01-21  REASON FOR VISIT: follow up HISTORY FROM: patient  Chief Complaint  Patient presents with  . Follow-up    Yearly follow up. Alone. Rm 5. No new concerns at this time.      HISTORY OF PRESENT ILLNESS: Today 10/22/18 Glenn Rosario is a 38 y.o. male here today for follow up for seizure.  He continues levetiracetam 1500 mg twice daily as well as Zonegran 200 mg twice daily.  He is tolerating these medications well and without obvious adverse effects.  He denies any seizure activity.  He reports last seizure was at least 5 years ago.  He does continue to have an essential tremor.  He reports that tremor is much worse during times of stress.    It was noted at his last visit that his creatinine has been elevated for a couple of years.  He was advised to establish care with a primary care provider.  He has not yet done so.  He does admit to regular NSAID use.  He alternates between 800 mg of ibuprofen and 440 mg of Aleve.  He takes 1 or the other on a daily basis for knee pain.  He also drinks diet Dr. Malachi Bonds daily.  There is no known family history of kidney disease.  No known elevated blood pressures.   HISTORY: (copied from Dr Cathren Laine note on 10/21/2017)  HPI:  Glenn Rosario is a 39 y.o. male here as a referral from Dr. No ref. provider found for epilepsy.  I am seeing patient for the first time today.  He has generalized convulsive epilepsy and essential tremor.  Per review of notes he has a history of primary generalized myoclonic epilepsy.  His seizure disorder began in 1995.  He currently takes Keppra 1500 mg twice daily and Zonegran 200 mg twice daily.  He has some rare muscle twitching.  He denies any double vision, loss of vision speech or swallowing problems.  In the past stress and lack of sleep can cause seizures.  Has tried Klonopin, Dilantin and Tegretol in the past with side effects.  DEXA scan done in 2006 showed evidence of osteopenia.  He also  had difficulty with weight gain and daytime sleepiness and tremor.  Keppra was added in 2007 and he was tapered off Depakote.  He did not have seizures for 2 years until January 2009 at that time it appears Zonegran was possibly added. No seizures since last being seen. Well controlled on medication. Compliant.   Meds tried: Dilantin, Tegretol, Depakote, Klonopin on Keppra and zonisamide.  Reviewed notes, labs and imaging from outside physicians, which showed: see above  CT head 2002 which was normal by report   REVIEW OF SYSTEMS: Out of a complete 14 system review of symptoms, the patient complains only of the following symptoms, and all other reviewed systems are negative.  ALLERGIES: Allergies  Allergen Reactions  . Depakote [Divalproex Sodium]   . Klonopin [Clonazepam]   . Tegretol [Carbamazepine]     HOME MEDICATIONS: Outpatient Medications Prior to Visit  Medication Sig Dispense Refill  . Calcium Ascorbate (C-500 NON-ACID) 500 MG TABS Take 500 mg by mouth daily.     . Cholecalciferol (D 400 PO) Take 400 mg by mouth daily.    Marland Kitchen levETIRAcetam (KEPPRA) 750 MG tablet Take 2 tablets (1,500 mg total) by mouth 2 (two) times daily. 360 tablet 3  . zonisamide (ZONEGRAN) 100 MG capsule Take 2 capsules (200 mg total) by mouth  2 (two) times daily. 360 capsule 3   No facility-administered medications prior to visit.     PAST MEDICAL HISTORY: Past Medical History:  Diagnosis Date  . Osteopenia   . Seizure (HCC)     PAST SURGICAL HISTORY: Past Surgical History:  Procedure Laterality Date  . None      FAMILY HISTORY: Family History  Problem Relation Age of Onset  . Healthy Mother   . Pulmonary Hypertension Mother   . Healthy Father     SOCIAL HISTORY: Social History   Socioeconomic History  . Marital status: Single    Spouse name: Not on file  . Number of children: 0  . Years of education: 12+  . Highest education level: Not on file  Occupational History     Employer: [employer]  Social Needs  . Financial resource strain: Not on file  . Food insecurity    Worry: Not on file    Inability: Not on file  . Transportation needs    Medical: Not on file    Non-medical: Not on file  Tobacco Use  . Smoking status: Never Smoker  . Smokeless tobacco: Never Used  Substance and Sexual Activity  . Alcohol use: Yes    Alcohol/week: 5.0 standard drinks    Types: 5 Standard drinks or equivalent per week  . Drug use: No  . Sexual activity: Not on file  Lifestyle  . Physical activity    Days per week: Not on file    Minutes per session: Not on file  . Stress: Not on file  Relationships  . Social Musicianconnections    Talks on phone: Not on file    Gets together: Not on file    Attends religious service: Not on file    Active member of club or organization: Not on file    Attends meetings of clubs or organizations: Not on file    Relationship status: Not on file  . Intimate partner violence    Fear of current or ex partner: Not on file    Emotionally abused: Not on file    Physically abused: Not on file    Forced sexual activity: Not on file  Other Topics Concern  . Not on file  Social History Narrative   Patient is single and lives alone.    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    Patient drinks some caffeine.          PHYSICAL EXAM  Vitals:   10/22/18 0748  BP: 125/83  Pulse: 80  Temp: 97.7 F (36.5 C)  TempSrc: Oral  Weight: 252 lb 12.8 oz (114.7 kg)  Height: 5\' 11"  (1.803 m)   Body mass index is 35.26 kg/m.  Generalized: Well developed, in no acute distress  Cardiology: normal rate and rhythm, no murmur noted Neurological examination  Mentation: Alert oriented to time, place, history taking. Follows all commands speech and language fluent Cranial nerve II-XII: Pupils were equal round reactive to light. Extraocular movements were full, visual field were full on confrontational test. Facial sensation and strength were  normal. Uvula tongue midline. Head turning and shoulder shrug  were normal and symmetric. Motor: The motor testing reveals 5 over 5 strength of all 4 extremities. Good symmetric motor tone is noted throughout.   Coordination: Cerebellar testing reveals good finger-nose-finger bilaterally.  Gait and station: Gait is normal. Tandem gait is normal.    DIAGNOSTIC DATA (LABS, IMAGING, TESTING) - I reviewed patient records, labs, notes,  testing and imaging myself where available.  No flowsheet data found.   Lab Results  Component Value Date   WBC 6.9 10/21/2017   HGB 16.8 10/21/2017   HCT 50.9 10/21/2017   MCV 93 10/21/2017   PLT CANCELED 10/21/2017      Component Value Date/Time   NA 140 10/21/2017 0914   K 4.2 10/21/2017 0914   CL 105 10/21/2017 0914   CO2 20 10/21/2017 0914   GLUCOSE 90 10/21/2017 0914   BUN 13 10/21/2017 0914   CREATININE 1.33 (H) 10/21/2017 0914   CALCIUM 9.5 10/21/2017 0914   PROT 7.0 10/21/2017 0914   ALBUMIN 4.4 10/21/2017 0914   AST 22 10/21/2017 0914   ALT 42 10/21/2017 0914   ALKPHOS 75 10/21/2017 0914   BILITOT 0.6 10/21/2017 0914   GFRNONAA 67 10/21/2017 0914   GFRAA 78 10/21/2017 0914   No results found for: CHOL, HDL, LDLCALC, LDLDIRECT, TRIG, CHOLHDL No results found for: WUJW1XHGBA1C No results found for: VITAMINB12 Lab Results  Component Value Date   TSH 1.280 10/21/2017    ASSESSMENT AND PLAN 38 y.o. year old male  has a past medical history of Osteopenia and Seizure (HCC). here with     ICD-10-CM   1. Generalized convulsive epilepsy (HCC)  G40.309 CBC with Differential/Platelets    CMP    Levetiracetam level  2. Essential tremor  G25.0 CBC with Differential/Platelets    CMP    Minerva Areolaric continues to do well from a seizure perspective with levetiracetam 1500 mg twice daily and Zonegran 200 mg twice daily.  No recent seizure activity.  Last seizure was 5 years ago.  He does continue to suffer with essential tremor.  I have advised that he  monitor his intake of caffeine.  We will also update labs today.  We have had a discussion regarding his elevated creatinine.  I have encouraged him to establish care with a primary care provider.  I have also advised that he limit NSAIDs and soda.  We will follow-up annually for seizure disorder and tremor.  Hemolyze his understanding and agreement with this plan.   Orders Placed This Encounter  Procedures  . CBC with Differential/Platelets  . CMP  . Levetiracetam level     No orders of the defined types were placed in this encounter.    I spent 15 minutes with the patient. 50% of this time was spent counseling and educating patient on plan of care and medications.    Shawnie Dappermy Jazzmon Prindle, FNP-C 10/22/2018, 8:20 AM [facility] [address], [address] [place], [place] [id number] [phone]

## 2018-10-23 ENCOUNTER — Ambulatory Visit: Payer: 59 | Admitting: Neurology

## 2018-10-23 ENCOUNTER — Ambulatory Visit: Payer: 59 | Admitting: Nurse Practitioner

## 2018-10-24 LAB — COMPREHENSIVE METABOLIC PANEL
ALT: 41 IU/L (ref 0–44)
AST: 27 IU/L (ref 0–40)
Albumin/Globulin Ratio: 1.7 (ref 1.2–2.2)
Albumin: 4.6 g/dL (ref 4.0–5.0)
Alkaline Phosphatase: 79 IU/L (ref 39–117)
BUN/Creatinine Ratio: 7 — ABNORMAL LOW (ref 9–20)
BUN: 10 mg/dL (ref 6–20)
Bilirubin Total: 0.8 mg/dL (ref 0.0–1.2)
CO2: 21 mmol/L (ref 20–29)
Calcium: 9.2 mg/dL (ref 8.7–10.2)
Chloride: 104 mmol/L (ref 96–106)
Creatinine, Ser: 1.49 mg/dL — ABNORMAL HIGH (ref 0.76–1.27)
GFR calc Af Amer: 67 mL/min/{1.73_m2} (ref >59)
GFR calc non Af Amer: 58 mL/min/{1.73_m2} — ABNORMAL LOW (ref >59)
Globulin, Total: 2.7 g/dL (ref 1.5–4.5)
Glucose: 99 mg/dL (ref 65–99)
Potassium: 4.2 mmol/L (ref 3.5–5.2)
Sodium: 141 mmol/L (ref 134–144)
Total Protein: 7.3 g/dL (ref 6.0–8.5)

## 2018-10-24 LAB — CBC WITH DIFFERENTIAL/PLATELET
Basophils Absolute: 0.1 10*3/uL (ref 0.0–0.2)
Basos: 1 %
EOS (ABSOLUTE): 0.2 10*3/uL (ref 0.0–0.4)
Eos: 3 %
Hematocrit: 48.3 % (ref 37.5–51.0)
Hemoglobin: 16.8 g/dL (ref 13.0–17.7)
Immature Grans (Abs): 0 10*3/uL (ref 0.0–0.1)
Immature Granulocytes: 1 %
Lymphocytes Absolute: 1.6 10*3/uL (ref 0.7–3.1)
Lymphs: 25 %
MCH: 31.3 pg (ref 26.6–33.0)
MCHC: 34.8 g/dL (ref 31.5–35.7)
MCV: 90 fL (ref 79–97)
Monocytes Absolute: 0.6 10*3/uL (ref 0.1–0.9)
Monocytes: 9 %
Neutrophils Absolute: 3.8 10*3/uL (ref 1.4–7.0)
Neutrophils: 61 %
Platelets: 180 10*3/uL (ref 150–450)
RBC: 5.37 x10E6/uL (ref 4.14–5.80)
RDW: 12.6 % (ref 11.6–15.4)
WBC: 6.2 10*3/uL (ref 3.4–10.8)

## 2018-10-24 LAB — LEVETIRACETAM LEVEL: Levetiracetam Lvl: 9.1 ug/mL — ABNORMAL LOW (ref 10.0–40.0)

## 2018-10-27 ENCOUNTER — Telehealth: Payer: Self-pay

## 2018-10-27 NOTE — Telephone Encounter (Signed)
Spoke with the patient and he verbalized understanding his results. No questions or concerns at this time.   

## 2018-10-27 NOTE — Telephone Encounter (Signed)
-----   Message from Debbora Presto, NP sent at 10/27/2018  7:48 AM EDT ----- Please let him know that his Keppra levels were a little low. Is he taking medication everyday without missed doses? Please make sure medication is taken regularly. Also, his creatinine is elevated. It is a little higher than his previous result last year. Please remind him to avoid NSAIDS as well as sodas. I would like for him to establish care with a PCP to monitor this closely.

## 2018-10-28 ENCOUNTER — Other Ambulatory Visit: Payer: Self-pay | Admitting: Neurology

## 2018-11-17 ENCOUNTER — Telehealth: Payer: Self-pay | Admitting: *Deleted

## 2018-11-17 DIAGNOSIS — Z0289 Encounter for other administrative examinations: Secondary | ICD-10-CM

## 2018-11-17 NOTE — Telephone Encounter (Signed)
R/c pt [employer] form on 11/17/18 from [place] desk.

## 2018-11-18 NOTE — Telephone Encounter (Signed)
FMLA completed.  To AL/NP desk for review and signature for seizures.

## 2018-11-19 NOTE — Telephone Encounter (Signed)
To MR. 

## 2018-11-19 NOTE — Telephone Encounter (Signed)
Pt form faxed to [employer] on 11/19/18

## 2018-11-19 NOTE — Telephone Encounter (Signed)
Signed and returned

## 2019-10-09 ENCOUNTER — Other Ambulatory Visit: Payer: Self-pay

## 2019-10-09 ENCOUNTER — Encounter: Payer: Self-pay | Admitting: *Deleted

## 2019-10-09 ENCOUNTER — Ambulatory Visit: Payer: Self-pay

## 2019-10-09 ENCOUNTER — Emergency Department (INDEPENDENT_AMBULATORY_CARE_PROVIDER_SITE_OTHER)
Admission: EM | Admit: 2019-10-09 | Discharge: 2019-10-09 | Disposition: A | Discharge status: Home / Self Care | Payer: 59 | Source: Home / Self Care | Attending: Family Medicine | Admitting: Family Medicine

## 2019-10-09 DIAGNOSIS — M1009 Idiopathic gout, multiple sites: Secondary | ICD-10-CM | POA: Diagnosis not present

## 2019-10-09 MED ORDER — METHYLPREDNISOLONE SODIUM SUCC 125 MG IJ SOLR
80.0000 mg | Freq: Once | INTRAMUSCULAR | Status: AC
  Administered 2019-10-09: 80 mg via INTRAMUSCULAR

## 2019-10-09 MED ORDER — HYDROCODONE-ACETAMINOPHEN 5-325 MG PO TABS: ORAL_TABLET | ORAL | 0 refills | Status: DC

## 2019-10-09 MED ORDER — PREDNISONE 20 MG PO TABS
ORAL_TABLET | ORAL | 0 refills | Status: DC
Start: 2019-10-09 — End: 2020-02-19

## 2019-10-09 NOTE — Discharge Instructions (Addendum)
Begin prednisone Saturday 10/10/19. Increase fluid intake.  Drink enough fluids to keep your urine pale yellow.  If symptoms become significantly worse during the night or over the weekend, proceed to the local emergency room.

## 2019-10-09 NOTE — ED Provider Notes (Signed)
[facility]    CSN: [id number] Arrival date & time: 10/09/19  5427      History   Chief Complaint Chief Complaint  Patient presents with  . Foot Swelling    HPI Glenn Rosario is a 39 y.o. male.   Patient reports that he went walking 6 days ago, then gradually developed increased pain/swelling in his left foot concentrated around his first MTP joint and smaller toes.  Two to three days ago developed similar pain in his right foot. He recalls that he sprained his right foot about 3+ weeks ago with gradual improvement.  He reports that he went to [place] two weeks ago where he admits that he ate increased amounts of seafood and red meat, in addition to increased alcoholic beverages.  He has had two episodes of gout in his left foot (last episode November 2020).        The history is provided by the patient.  Foot Pain This is a recurrent problem. Episode onset: 6 days ago. The problem occurs constantly. The problem has been gradually worsening. Pertinent negatives include no chest pain and no abdominal pain. The symptoms are aggravated by walking. Nothing relieves the symptoms. Treatments tried: Ibuprofen and Aleve. The treatment provided mild relief.    Past Medical History:  Diagnosis Date  . Osteopenia   . Seizure [facility])     Patient Active Problem List   Diagnosis Date Noted  . 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 Encounter for therapeutic drug monitoring 10/15/2016  . Generalized convulsive epilepsy (HCC) 12/22/2012  . Essential tremor 12/22/2012    Past Surgical History:  Procedure Laterality Date  . None         Home Medications    Prior to Admission medications   Medication Sig Start Date End Date Taking? Authorizing Provider  Calcium Ascorbate (C-500 NON-ACID) 500 MG TABS Take 500 mg by mouth daily.     [provider]  Cholecalciferol (D 400 PO) Take 400 mg by mouth daily.    [provider]    HYDROcodone-acetaminophen (NORCO/VICODIN) 5-325 MG tablet Take one by mouth at bedtime as needed for pain 10/09/19   Glenn Haw, MD  levETIRAcetam (KEPPRA) 750 MG tablet TAKE 2 TABLETS BY MOUTH TWO TIMES DAILY 10/28/18   Anson Fret, MD  predniSONE (DELTASONE) 20 MG tablet Take one tab by mouth twice daily for 4 days, then one daily for 3 days. Take with food. 10/09/19   Glenn Haw, MD  zonisamide (ZONEGRAN) 100 MG capsule TAKE 2 CAPSULES BY MOUTH  TWO TIMES DAILY 10/28/18   Anson Fret, MD    Family History Family History  Problem Relation Age of Onset  . Healthy Mother   . Pulmonary Hypertension Mother   . Healthy Father     Social History Social History   Tobacco Use  . Smoking status: Never Smoker  . Smokeless tobacco: Never Used  Vaping Use  . Vaping Use: Never used  Substance Use Topics  . Alcohol use: Yes    Alcohol/week: 5.0 standard drinks    Types: 5 Standard drinks or equivalent per week  . Drug use: No     Allergies   Depakote [divalproex sodium], Klonopin [clonazepam], and Tegretol [carbamazepine]   Review of Systems Review of Systems  Constitutional: Positive for activity change. Negative for appetite change, chills, diaphoresis, fatigue and fever.  Cardiovascular: Negative for chest pain.  Gastrointestinal: Negative for abdominal pain.  Musculoskeletal: Positive for  joint swelling.  Skin: Positive for color change.  All other systems reviewed and are negative.    Physical Exam Triage Vital Signs ED Triage Vitals  Enc Vitals Group     BP 10/09/19 0909 133/90     Pulse Rate 10/09/19 0909 100     Resp 10/09/19 0909 16     Temp 10/09/19 0909 98.6 F (37 C)     Temp Source 10/09/19 0909 Oral     SpO2 10/09/19 0909 99 %     Weight 10/09/19 0910 246 lb (111.6 kg)     Height 10/09/19 0910 5\' 11"  (1.803 m)     Head Circumference --      Peak Flow --      Pain Score 10/09/19 0916 8     Pain Loc --      Pain Edu? --      Excl. in  [place]? --    No data found.  Updated Vital Signs BP 133/90 (BP Location: Left Arm)   Pulse 100   Temp 98.6 F (37 C) (Oral)   Resp 16   Ht 5\' 11"  (1.803 m)   Wt 111.6 kg   SpO2 99%   BMI 34.31 kg/m   Visual Acuity Right Eye Distance:   Left Eye Distance:   Bilateral Distance:    Right Eye Near:   Left Eye Near:    Bilateral Near:     Physical Exam Constitutional:      General: He is not in acute distress. HENT:     Head: Normocephalic.     Nose: Nose normal.     Mouth/Throat:     Pharynx: Oropharynx is clear.  Eyes:     Pupils: Pupils are equal, round, and reactive to light.  Cardiovascular:     Rate and Rhythm: Tachycardia present.     Heart sounds: Normal heart sounds.  Pulmonary:     Breath sounds: Normal breath sounds.  Abdominal:     Palpations: Abdomen is soft.     Tenderness: There is no abdominal tenderness.  Musculoskeletal:     Right lower leg: No edema.     Left lower leg: No edema.       Feet:     Comments: Both feet are mildly erythematous and warm.  Left foot has distinct tenderness to palpation over the first MTP joint and 2nd through 5th toes. Pedal pulses are intact. There is no calf tenderness in either leg.                                                                                                                                                                       Lymphadenopathy:     Cervical: No cervical adenopathy.  Skin:  General: Skin is warm and dry.  Neurological:     General: No focal deficit present.     Mental Status: He is alert.      UC Treatments / Results  Labs (all labs ordered are listed, but only abnormal results are displayed) Labs Reviewed  URIC ACID    EKG   Radiology No results found.  Procedures Procedures (including critical care time)  Medications Ordered in UC Medications  methylPREDNISolone sodium succinate (SOLU-MEDROL) 125 mg/2 mL injection 80 mg (80 mg Intramuscular Given 10/09/19 1003)     Initial Impression / Assessment and Plan / UC Course  I have reviewed the triage vital signs and the nursing notes.  Pertinent labs & imaging results that were available during my care of the patient were reviewed by me and considered in my medical decision making (see chart for details).    Chart review reveals that patient has impaired renal function. Uric acid pending.   Administered Solumedrol 125mg  IM; then begin prednisone burst/taper. Rx for Vicodin at bedtime (#5, no refill). Followup with Family Doctor if not improved in one week.    Final Clinical Impressions(s) / UC Diagnoses   Final diagnoses:  Acute idiopathic gout of multiple sites     Discharge Instructions     Begin prednisone Saturday 10/10/19. Increase fluid intake.  Drink enough fluids to keep your urine pale yellow.  If symptoms become significantly worse during the night or over the weekend, proceed to the local emergency room.     ED Prescriptions    Medication Sig Dispense Auth. Provider   predniSONE (DELTASONE) 20 MG tablet Take one tab by mouth twice daily for 4 days, then one daily for 3 days. Take with food. 11 tablet 12/10/19, MD   HYDROcodone-acetaminophen (NORCO/VICODIN) 5-325 MG tablet Take one by mouth at bedtime as needed for pain 5 tablet Glenn Haw, MD        Glenn Haw, MD 10/09/19 1021

## 2019-10-09 NOTE — ED Triage Notes (Addendum)
X 6days ago developed swelling in left foot. Throughout the past week both feet have swollen, reddened and developed burning and intermittent red spots. H/o of gout in past. Also h/o fractures in both feet. Apt scheduled with orthopaedic in 3 days. Recent trip to the beach eating fried foods and red meat.

## 2019-10-10 LAB — URIC ACID: Uric Acid, Serum: 8.7 mg/dL — ABNORMAL HIGH (ref 4.0–8.0)

## 2019-10-12 ENCOUNTER — Ambulatory Visit: Payer: Self-pay | Admitting: Orthopedic Surgery

## 2019-10-19 ENCOUNTER — Telehealth: Payer: Self-pay | Admitting: Family Medicine

## 2019-10-19 NOTE — Telephone Encounter (Signed)
Pt gave consent for insurance to be filed for vv  Pt understands that although there may be some limitations with this type of visit, we will take all precautions to reduce any security or privacy concerns.  Pt understands that this will be treated like an in office visit and we will file with pt's insurance, and there may be a patient responsible charge related to this service.  

## 2019-10-22 ENCOUNTER — Telehealth (INDEPENDENT_AMBULATORY_CARE_PROVIDER_SITE_OTHER): Payer: 59 | Admitting: Family Medicine

## 2019-10-22 ENCOUNTER — Encounter: Payer: Self-pay | Admitting: Family Medicine

## 2019-10-22 DIAGNOSIS — G40309 Generalized idiopathic epilepsy and epileptic syndromes, not intractable, without status epilepticus: Secondary | ICD-10-CM | POA: Diagnosis not present

## 2019-10-22 MED ORDER — LEVETIRACETAM 750 MG PO TABS: ORAL_TABLET | ORAL | 3 refills | Status: DC

## 2019-10-22 MED ORDER — ZONISAMIDE 100 MG PO CAPS: ORAL_CAPSULE | ORAL | 3 refills | Status: DC

## 2019-10-22 NOTE — Progress Notes (Addendum)
   PATIENT: Glenn Rosario DOB: 03-27-1981  REASON FOR VISIT: follow up HISTORY FROM: patient  Virtual Visit via Telephone Note  I connected with Glenn Rosario on 10/22/19 at  8:00 AM EDT by telephone and verified that I am speaking with the correct person using two identifiers.   I discussed the limitations, risks, security and privacy concerns of performing an evaluation and management service by telephone and the availability of in person appointments. I also discussed with the patient that there may be a patient responsible charge related to this service. The patient expressed understanding and agreed to proceed.   History of Present Illness:  10/22/19 Glenn Rosario is a 39 y.o. male here today for follow up for seizure. He is doing well. No recent seizure activity. He is tolerating levetiracetam 1500mg  BID and zonisamide 200mg  BID. No concerns today.    Observations/Objective:  Generalized: Well developed, in no acute distress  Mentation: Alert oriented to time, place, history taking. Follows all commands speech and language fluent   Assessment and Plan:  39 y.o. year old male  has a past medical history of Osteopenia and Seizure (HCC). here with    ICD-10-CM   1. Generalized convulsive epilepsy (HCC)  G40.309 levETIRAcetam (KEPPRA) 750 MG tablet    zonisamide (ZONEGRAN) 100 MG capsule   Glenn Rosario is doing very well. He is tolerating seizure regimen. We will continue levetiracetam 1500mg  BID and zonisamide 200mg  BID. He was encouraged to continue close follow up with PCP. Creatinine elevated in the past. He has follow up with PCP in August. Avoidance of Nsaids discussed. Adequate hydration and blood pressure monitoring encouraged. He will follow up with me in 1 year, sooner if needed. He verbalizes understanding and agreement with this plan.    No orders of the defined types were placed in this encounter.   Meds ordered this encounter  Medications  . levETIRAcetam (KEPPRA) 750 MG  tablet    Sig: TAKE 2 TABLETS BY MOUTH TWO TIMES DAILY    Dispense:  360 tablet    Refill:  3    Order Specific Question:   Supervising Provider    Answer:   Minerva Areola  . zonisamide (ZONEGRAN) 100 MG capsule    Sig: TAKE 2 CAPSULES BY MOUTH  TWO TIMES DAILY    Dispense:  360 capsule    Refill:  3    Order Specific Question:   Supervising Provider    Answer:   September     Follow Up Instructions:  I discussed the assessment and treatment plan with the patient. The patient was provided an opportunity to ask questions and all were answered. The patient agreed with the plan and demonstrated an understanding of the instructions.   The patient was advised to call back or seek an in-person evaluation if the symptoms worsen or if the condition fails to improve as anticipated.  I provided 15 minutes of non-face-to-face time during this encounter. Patient was located at his place of residence during Mychart visit. Provider is in the office.    Anson Fret, NP   Made any corrections needed, and agree with history, physical, neuro exam,assessment and plan as stated.     [id number], MD [facility]

## 2019-11-18 ENCOUNTER — Telehealth: Payer: Self-pay | Admitting: *Deleted

## 2019-11-18 NOTE — Telephone Encounter (Signed)
The patient to inform us his FMLA ppw will be faxed to our office today. He would like it faxed directly back to [facility]. He would like a call once it has been completed and sent.

## 2019-11-19 DIAGNOSIS — Z0289 Encounter for other administrative examinations: Secondary | ICD-10-CM

## 2019-11-24 NOTE — Telephone Encounter (Signed)
Completed and signed. To MR. 11-24-19.

## 2019-11-25 NOTE — Telephone Encounter (Signed)
Pt [employer] form faxed on 11/25/19

## 2019-11-30 ENCOUNTER — Telehealth: Payer: Self-pay | Admitting: *Deleted

## 2019-11-30 NOTE — Telephone Encounter (Signed)
Pt called, has question about his FMLA paper work . Please call [phone]

## 2019-11-30 NOTE — Telephone Encounter (Signed)
[name] in MR took care of this call, I was not needed.

## 2020-02-19 ENCOUNTER — Other Ambulatory Visit: Payer: Self-pay

## 2020-02-19 ENCOUNTER — Ambulatory Visit (INDEPENDENT_AMBULATORY_CARE_PROVIDER_SITE_OTHER): Payer: 59 | Admitting: Family Medicine

## 2020-02-19 ENCOUNTER — Encounter: Payer: Self-pay | Admitting: Family Medicine

## 2020-02-19 VITALS — BP 119/76 | HR 89 | Temp 98.3°F | Ht 71.0 in | Wt 223.8 lb

## 2020-02-19 DIAGNOSIS — R739 Hyperglycemia, unspecified: Secondary | ICD-10-CM

## 2020-02-19 DIAGNOSIS — M109 Gout, unspecified: Secondary | ICD-10-CM

## 2020-02-19 DIAGNOSIS — Z0001 Encounter for general adult medical examination with abnormal findings: Secondary | ICD-10-CM

## 2020-02-19 DIAGNOSIS — G40309 Generalized idiopathic epilepsy and epileptic syndromes, not intractable, without status epilepticus: Secondary | ICD-10-CM

## 2020-02-19 DIAGNOSIS — H9190 Unspecified hearing loss, unspecified ear: Secondary | ICD-10-CM

## 2020-02-19 DIAGNOSIS — R7989 Other specified abnormal findings of blood chemistry: Secondary | ICD-10-CM | POA: Insufficient documentation

## 2020-02-19 DIAGNOSIS — Z13220 Encounter for screening for lipoid disorders: Secondary | ICD-10-CM

## 2020-02-19 NOTE — Assessment & Plan Note (Signed)
Managed by neurology. Currently on keppra 1500mg  daily and zonisamide 200mg  daily.

## 2020-02-19 NOTE — Patient Instructions (Signed)
It was very nice to see you today!  We will check your blood work today.  We will send in your allopurinol depending on the results.  I will see you back in year for your next physical.  Please come back to see me sooner if needed.  Take care, Dr Jerline Pain  Please try these tips to maintain a healthy lifestyle:   Eat at least 3 REAL meals and 1-2 snacks per day.  Aim for no more than 5 hours between eating.  If you eat breakfast, please do so within one hour of getting up.    Each meal should contain half fruits/vegetables, one quarter protein, and one quarter carbs (no bigger than a computer mouse)   Cut down on sweet beverages. This includes juice, soda, and sweet tea.     Drink at least 1 glass of water with each meal and aim for at least 8 glasses per day   Exercise at least 150 minutes every week.    Preventive Care 41-2 Years Old, Male Preventive care refers to lifestyle choices and visits with your health care provider that can promote health and wellness. This includes:  A yearly physical exam. This is also called an annual well check.  Regular dental and eye exams.  Immunizations.  Screening for certain conditions.  Healthy lifestyle choices, such as eating a healthy diet, getting regular exercise, not using drugs or products that contain nicotine and tobacco, and limiting alcohol use. What can I expect for my preventive care visit? Physical exam Your health care provider will check:  Height and weight. These may be used to calculate body mass index (BMI), which is a measurement that tells if you are at a healthy weight.  Heart rate and blood pressure.  Your skin for abnormal spots. Counseling Your health care provider may ask you questions about:  Alcohol, tobacco, and drug use.  Emotional well-being.  Home and relationship well-being.  Sexual activity.  Eating habits.  Work and work Statistician. What immunizations do I need?  Influenza (flu)  vaccine  This is recommended every year. Tetanus, diphtheria, and pertussis (Tdap) vaccine  You may need a Td booster every 10 years. Varicella (chickenpox) vaccine  You may need this vaccine if you have not already been vaccinated. Zoster (shingles) vaccine  You may need this after age 41. Measles, mumps, and rubella (MMR) vaccine  You may need at least one dose of MMR if you were born in 1957 or later. You may also need a second dose. Pneumococcal conjugate (PCV13) vaccine  You may need this if you have certain conditions and were not previously vaccinated. Pneumococcal polysaccharide (PPSV23) vaccine  You may need one or two doses if you smoke cigarettes or if you have certain conditions. Meningococcal conjugate (MenACWY) vaccine  You may need this if you have certain conditions. Hepatitis A vaccine  You may need this if you have certain conditions or if you travel or work in places where you may be exposed to hepatitis A. Hepatitis B vaccine  You may need this if you have certain conditions or if you travel or work in places where you may be exposed to hepatitis B. Haemophilus influenzae type b (Hib) vaccine  You may need this if you have certain risk factors. Human papillomavirus (HPV) vaccine  If recommended by your health care provider, you may need three doses over 6 months. You may receive vaccines as individual doses or as more than one vaccine together in one shot (  combination vaccines). Talk with your health care provider about the risks and benefits of combination vaccines. What tests do I need? Blood tests  Lipid and cholesterol levels. These may be checked every 5 years, or more frequently if you are over 41 years old.  Hepatitis C test.  Hepatitis B test. Screening  Lung cancer screening. You may have this screening every year starting at age 41 if you have a 30-pack-year history of smoking and currently smoke or have quit within the past 15  years.  Prostate cancer screening. Recommendations will vary depending on your family history and other risks.  Colorectal cancer screening. All adults should have this screening starting at age 41 and continuing until age 18. Your health care provider may recommend screening at age 41 if you are at increased risk. You will have tests every 1-10 years, depending on your results and the type of screening test.  Diabetes screening. This is done by checking your blood sugar (glucose) after you have not eaten for a while (fasting). You may have this done every 1-3 years.  Sexually transmitted disease (STD) testing. Follow these instructions at home: Eating and drinking  Eat a diet that includes fresh fruits and vegetables, whole grains, lean protein, and low-fat dairy products.  Take vitamin and mineral supplements as recommended by your health care provider.  Do not drink alcohol if your health care provider tells you not to drink.  If you drink alcohol: ? Limit how much you have to 0-2 drinks a day. ? Be aware of how much alcohol is in your drink. In the [place] one drink equals one 12 oz bottle of beer (355 mL), one 5 oz glass of wine (148 mL), or one 1 oz glass of hard liquor (44 mL). Lifestyle  Take daily care of your teeth and gums.  Stay active. Exercise for at least 30 minutes on 5 or more days each week.  Do not use any products that contain nicotine or tobacco, such as cigarettes, e-cigarettes, and chewing tobacco. If you need help quitting, ask your health care provider.  If you are sexually active, practice safe sex. Use a condom or other form of protection to prevent STIs (sexually transmitted infections).  Talk with your health care provider about taking a low-dose aspirin every day starting at age 41. What's next?  Go to your health care provider once a year for a well check visit.  Ask your health care provider how often you should have your eyes and teeth  checked.  Stay up to date on all vaccines. This information is not intended to replace advice given to you by your health care provider. Make sure you discuss any questions you have with your health care provider. Document Revised: 04/10/2018 Document Reviewed: 04/10/2018 Elsevier Patient Education  2020 [employer].

## 2020-02-19 NOTE — Progress Notes (Signed)
Chief Complaint:  Glenn Rosario is a 39 y.o. male who presents today for his annual comprehensive physical exam.  He is a new patient.   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 Currently on keppra 1500mg  daily and zonisamide 200mg  daily.   Hearing loss Recently lost hearing aid.  Symptoms are currently manageable.  He has been following with audiology he has been told at some point he will need cochlear implants.  He is not yet ready for this.  Gout No acute flare.  We will continue allopurinol 100 mg daily check uric acid level today.  Depending on renal function may need to adjust dose.  Elevated serum creatinine Mildly elevated on recent labs.  Will recheck today.  Preventative Healthcare: Check CBC, CMET, TSH, lipid panel.  Check A1c.  Up-to-date on vaccines.  Due for colon cancer screening at age 89 and prostate cancer screening age 89.  Patient Counseling(The following topics were reviewed and/or handout was given):  -Nutrition: Stressed importance of moderation in sodium/caffeine intake, saturated fat and cholesterol, caloric balance, sufficient intake of fresh fruits, vegetables, and fiber.  -Stressed the importance of regular exercise.   -Substance Abuse: Discussed cessation/primary prevention of tobacco, alcohol, or other drug use; driving or other dangerous activities under the influence; availability of treatment for abuse.   -Injury prevention: Discussed safety belts, safety helmets, smoke detector, smoking near bedding or upholstery.   -Sexuality: Discussed sexually transmitted diseases, partner selection, use of condoms, avoidance of unintended pregnancy and contraceptive alternatives.   -Dental health: Discussed importance of regular tooth brushing, flossing, and dental visits.  -Health maintenance and immunizations reviewed. Please refer to Health maintenance section.  Return to care in 1 year for next  preventative visit.     Subjective:  HPI:  He has no acute complaints today.   He is establishing care with a PCP.  He has been in the area for about 20 years.  He has a history of epilepsy and has been following with neurology for this.  Was recently diagnosed with gout has been on allopurinol which seems to be helping.  We will get labs done today.   Lifestyle Diet: Balanced. Trying to get more fruits and vegetables.  Exercise: Working on 54.   Depression screen PHQ 2/9 02/19/2020  Decreased Interest 0  Down, Depressed, Hopeless 0  PHQ - 2 Score 0    Health Maintenance Due  Topic Date Due  . Hepatitis C Screening  Never done  . COVID-19 Vaccine (1) Never done  . HIV Screening  Never done     ROS: Per HPI, otherwise a complete review of systems was negative.   PMH:  The following were reviewed and entered/updated in epic: Past Medical History:  Diagnosis Date  . Osteopenia   . Seizure [facility])    Patient Active Problem List   Diagnosis Date Noted  . Elevated serum creatinine 02/19/2020  . Gout 02/19/2020  . Hearing loss 02/19/2020  . 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 Encounter for therapeutic drug monitoring 10/15/2016  . Generalized convulsive epilepsy (HCC) 12/22/2012  . Essential tremor 12/22/2012   Past Surgical History:  Procedure Laterality Date  . None      Family History  Problem Relation Age of Onset  . Healthy Mother   . Pulmonary Hypertension Mother   . Healthy Father     Medications- reviewed and updated Current Outpatient Medications  Medication Sig Dispense Refill  . allopurinol (  ZYLOPRIM) 100 MG tablet Take 100 mg by mouth daily.    . Ascorbic Acid (VITAMIN C) 100 MG tablet Take 100 mg by mouth daily.    . Calcium Ascorbate (C-500 NON-ACID) 500 MG TABS Take 500 mg by mouth daily.     . Cholecalciferol (D 400 PO) Take 400 mg by mouth daily.    Marland Kitchen levETIRAcetam (KEPPRA) 750 MG tablet TAKE 2 TABLETS BY MOUTH  TWO TIMES DAILY 360 tablet 3  . zonisamide (ZONEGRAN) 100 MG capsule TAKE 2 CAPSULES BY MOUTH  TWO TIMES DAILY 360 capsule 3   No current facility-administered medications for this visit.    Allergies-reviewed and updated Allergies  Allergen Reactions  . Depakote [Divalproex Sodium]   . Klonopin [Clonazepam]   . Tegretol [Carbamazepine]     Social History   Socioeconomic History  . Marital status: Single    Spouse name: Not on file  . Number of children: 0  . Years of education: 12+  . Highest education level: Not on file  Occupational History    Employer: [employer]  Tobacco Use  . Smoking status: Never Smoker  . Smokeless tobacco: Never Used  Vaping Use  . Vaping Use: Never used  Substance and Sexual Activity  . Alcohol use: Yes    Alcohol/week: 5.0 standard drinks    Types: 5 Standard drinks or equivalent per week  . Drug use: No  . Sexual activity: Not on file  Other Topics Concern  . Not on file  Social History Narrative   Patient is single and lives alone.    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    Patient drinks some caffeine.       Social Determinants of Health   Financial Resource Strain:   . Difficulty of Paying Living Expenses: Not on file  Food Insecurity:   . Worried About Programme researcher, broadcasting/film/video in the Last Year: Not on file  . Ran Out of Food in the Last Year: Not on file  Transportation Needs:   . Lack of Transportation (Medical): Not on file  . Lack of Transportation (Non-Medical): Not on file  Physical Activity:   . Days of Exercise per Week: Not on file  . Minutes of Exercise per Session: Not on file  Stress:   . Feeling of Stress : Not on file  Social Connections:   . Frequency of Communication with Friends and Family: Not on file  . Frequency of Social Gatherings with Friends and Family: Not on file  . Attends Religious Services: Not on file  . Active Member of Clubs or Organizations: Not on file  . Attends Banker  Meetings: Not on file  . Marital Status: Not on file        Objective:  Physical Exam: BP 119/76   Pulse 89   Temp 98.3 F (36.8 C) (Temporal)   Ht 5\' 11"  (1.803 m)   Wt 223 lb 12.8 oz (101.5 kg)   SpO2 98%   BMI 31.21 kg/m   Body mass index is 31.21 kg/m. Wt Readings from Last 3 Encounters:  02/19/20 223 lb 12.8 oz (101.5 kg)  10/09/19 246 lb (111.6 kg)  10/22/18 252 lb 12.8 oz (114.7 kg)   Gen: NAD, resting comfortably HEENT: TMs normal bilaterally. OP clear. No thyromegaly noted.  CV: RRR with no murmurs appreciated Pulm: NWOB, CTAB with no crackles, wheezes, or rhonchi GI: Normal bowel sounds present. Soft, Nontender, Nondistended. MSK: no edema, cyanosis, or  clubbing noted Skin: warm, dry Neuro: CN2-12 grossly intact. Strength 5/5 in upper and lower extremities. Reflexes symmetric and intact bilaterally.  Psych: Normal affect and thought content     Yovanni Frenette M. Jimmey Ralph, MD 02/19/2020 9:56 AM

## 2020-02-19 NOTE — Assessment & Plan Note (Signed)
Mildly elevated on recent labs.  Will recheck today.

## 2020-02-19 NOTE — Assessment & Plan Note (Signed)
No acute flare.  We will continue allopurinol 100 mg daily check uric acid level today.  Depending on renal function may need to adjust dose.

## 2020-02-19 NOTE — Assessment & Plan Note (Signed)
Recently lost hearing aid.  Symptoms are currently manageable.  He has been following with audiology he has been told at some point he will need cochlear implants.  He is not yet ready for this.

## 2020-02-20 LAB — LIPID PANEL
Cholesterol: 190 mg/dL (ref <200)
HDL: 41 mg/dL (ref >=40)
LDL Cholesterol (Calc): 127 mg/dL (calc) — ABNORMAL HIGH
Non-HDL Cholesterol (Calc): 149 mg/dL (calc) — ABNORMAL HIGH (ref <130)
Total CHOL/HDL Ratio: 4.6 (calc) (ref <5.0)
Triglycerides: 117 mg/dL (ref <150)

## 2020-02-20 LAB — CBC
HCT: 48.2 % (ref 38.5–50.0)
Hemoglobin: 16.1 g/dL (ref 13.2–17.1)
MCH: 30.9 pg (ref 27.0–33.0)
MCHC: 33.4 g/dL (ref 32.0–36.0)
MCV: 92.5 fL (ref 80.0–100.0)
MPV: 11.3 fL (ref 7.5–12.5)
Platelets: 200 10*3/uL (ref 140–400)
RBC: 5.21 10*6/uL (ref 4.20–5.80)
RDW: 12.7 % (ref 11.0–15.0)
WBC: 4.6 10*3/uL (ref 3.8–10.8)

## 2020-02-20 LAB — COMPREHENSIVE METABOLIC PANEL
AG Ratio: 1.7 (calc) (ref 1.0–2.5)
ALT: 33 U/L (ref 9–46)
AST: 17 U/L (ref 10–40)
Albumin: 4.4 g/dL (ref 3.6–5.1)
Alkaline phosphatase (APISO): 73 U/L (ref 36–130)
BUN: 15 mg/dL (ref 7–25)
CO2: 24 mmol/L (ref 20–32)
Calcium: 9.5 mg/dL (ref 8.6–10.3)
Chloride: 104 mmol/L (ref 98–110)
Creat: 1.22 mg/dL (ref 0.60–1.35)
Globulin: 2.6 g/dL (calc) (ref 1.9–3.7)
Glucose, Bld: 92 mg/dL (ref 65–99)
Potassium: 4.5 mmol/L (ref 3.5–5.3)
Sodium: 138 mmol/L (ref 135–146)
Total Bilirubin: 0.6 mg/dL (ref 0.2–1.2)
Total Protein: 7 g/dL (ref 6.1–8.1)

## 2020-02-20 LAB — TSH: TSH: 1.47 mIU/L (ref 0.40–4.50)

## 2020-02-20 LAB — HEMOGLOBIN A1C
Hgb A1c MFr Bld: 5.4 % of total Hgb (ref <5.7)
Mean Plasma Glucose: 108 (calc)
eAG (mmol/L): 6 (calc)

## 2020-02-20 LAB — URIC ACID: Uric Acid, Serum: 8 mg/dL (ref 4.0–8.0)

## 2020-02-22 ENCOUNTER — Encounter: Payer: Self-pay | Admitting: Family Medicine

## 2020-02-22 ENCOUNTER — Other Ambulatory Visit: Payer: Self-pay | Admitting: *Deleted

## 2020-02-22 DIAGNOSIS — E785 Hyperlipidemia, unspecified: Secondary | ICD-10-CM | POA: Insufficient documentation

## 2020-02-22 MED ORDER — ALLOPURINOL 300 MG PO TABS
300.0000 mg | ORAL_TABLET | Freq: Every day | ORAL | 1 refills | Status: DC
Start: 2020-02-22 — End: 2020-06-27

## 2020-02-22 NOTE — Progress Notes (Signed)
Please inform patient of the following:  Uric acid level is high.  Cholesterol is borderline. Everything else including kidney numbers are NORMAL. Recommend increasing allopurinol to 300mg  daily. Do not need to start cholesterol meds but he should keep working on diet and exercise and we can recheck in a year or so.  . Katina Degree, MD 02/22/2020 12:57 PM

## 2020-06-26 ENCOUNTER — Other Ambulatory Visit: Payer: Self-pay | Admitting: Family Medicine

## 2020-09-07 ENCOUNTER — Telehealth: Payer: Self-pay | Admitting: Family Medicine

## 2020-09-07 NOTE — Telephone Encounter (Signed)
..   Pt understands that although there may be some limitations with this type of visit, we will take all precautions to reduce any security or privacy concerns.  Pt understands that this will be treated like an in office visit and we will file with pt's insurance, and there may be a patient responsible charge related to this service. ? ?

## 2020-09-07 NOTE — Telephone Encounter (Signed)
Noted  

## 2020-10-17 ENCOUNTER — Telehealth (INDEPENDENT_AMBULATORY_CARE_PROVIDER_SITE_OTHER): Payer: 59 | Admitting: Family Medicine

## 2020-10-17 ENCOUNTER — Encounter: Payer: Self-pay | Admitting: Family Medicine

## 2020-10-17 DIAGNOSIS — G40309 Generalized idiopathic epilepsy and epileptic syndromes, not intractable, without status epilepticus: Secondary | ICD-10-CM | POA: Diagnosis not present

## 2020-10-17 MED ORDER — ZONISAMIDE 100 MG PO CAPS: ORAL_CAPSULE | ORAL | 3 refills | Status: DC

## 2020-10-17 MED ORDER — LEVETIRACETAM 750 MG PO TABS: ORAL_TABLET | ORAL | 3 refills | Status: DC

## 2020-10-17 NOTE — Patient Instructions (Signed)
Below is our plan:  We will continue levetiracetam 1500mg  and zonisamide 200mg  twice daily.   Please make sure you are staying well hydrated. I recommend 50-60 ounces daily. Well balanced diet and regular exercise encouraged. Consistent sleep schedule with 6-8 hours recommended.   Please continue follow up with care team as directed.   Follow up with me in 1 year   You may receive a survey regarding today's visit. I encourage you to leave honest feed back as I do use this information to improve patient care. Thank you for seeing me today!

## 2020-10-17 NOTE — Progress Notes (Addendum)
   PATIENT: Glenn Rosario DOB: Jan 13, 1981  REASON FOR VISIT: follow up HISTORY FROM: patient  Virtual Visit via Telephone Note  I connected with Glenn Rosario on 10/17/20 at  8:30 AM EDT by telephone and verified that I am speaking with the correct person using two identifiers.   I discussed the limitations, risks, security and privacy concerns of performing an evaluation and management service by telephone and the availability of in person appointments. I also discussed with the patient that there may be a patient responsible charge related to this service. The patient expressed understanding and agreed to proceed.   History of Present Illness:  10/17/20 ALL: Glenn Rosario returns for follow up for seizures. He continues levetiracetam 1500mg  BID and zonisamide 200mg  BID. He is tolerating medications well. He denies seizure activity since last being seen. He occasionally has intermittent tremors. He is feeling well, today, and without concerns.   10/22/2019 ALL: Glenn Rosario is a 40 y.o. male here today for follow up for seizure. He is doing well. No recent seizure activity. He is tolerating levetiracetam 1500mg  BID and zonisamide 200mg  BID. No concerns today.    Observations/Objective:  Generalized: Well developed, in no acute distress  Mentation: Alert oriented to time, place, history taking. Follows all commands speech and language fluent   Assessment and Plan:  40 y.o. year old male  has a past medical history of Anxiety, Osteopenia, and Seizure (HCC). here with    ICD-10-CM   1. Generalized convulsive epilepsy (HCC)  G40.309 levETIRAcetam (KEPPRA) 750 MG tablet    zonisamide (ZONEGRAN) 100 MG capsule      Psalm is doing very well. He is tolerating seizure regimen. We will continue levetiracetam 1500mg  BID and zonisamide 200mg  BID. He was encouraged to continue close follow up with PCP. Creatinine stable. He has follow up with PCP in October. Adequate hydration and well balanced diet  encouraged. He will follow up with me in 1 year, sooner if needed. He verbalizes understanding and agreement with this plan.    No orders of the defined types were placed in this encounter.   Meds ordered this encounter  Medications   levETIRAcetam (KEPPRA) 750 MG tablet    Sig: TAKE 2 TABLETS BY MOUTH TWO TIMES DAILY    Dispense:  360 tablet    Refill:  3    Order Specific Question:   Supervising Provider    Answer:   44   zonisamide (ZONEGRAN) 100 MG capsule    Sig: TAKE 2 CAPSULES BY MOUTH  TWO TIMES DAILY    Dispense:  360 capsule    Refill:  3    Order Specific Question:   Supervising Provider    Answer:   Minerva Areola      Follow Up Instructions:  I discussed the assessment and treatment plan with the patient. The patient was provided an opportunity to ask questions and all were answered. The patient agreed with the plan and demonstrated an understanding of the instructions.   The patient was advised to call back or seek an in-person evaluation if the symptoms worsen or if the condition fails to improve as anticipated.  I provided 15 minutes of non-face-to-face time during this encounter. Patient was located at his place of residence during Mychart visit. Provider is in the office.    , NP    agree with assessment and plan as stated.     November, MD [facility]

## 2020-12-08 ENCOUNTER — Other Ambulatory Visit: Payer: Self-pay | Admitting: Neurology

## 2020-12-08 ENCOUNTER — Telehealth: Payer: Self-pay | Admitting: Neurology

## 2020-12-08 DIAGNOSIS — Z0289 Encounter for other administrative examinations: Secondary | ICD-10-CM

## 2020-12-08 NOTE — Telephone Encounter (Signed)
Forms have been completed and signed and will be given to [name] to fax for the patient

## 2020-12-12 ENCOUNTER — Telehealth: Payer: Self-pay | Admitting: *Deleted

## 2020-12-12 NOTE — Telephone Encounter (Signed)
I faxed pt FMLA form on 12/12/20

## 2021-02-20 ENCOUNTER — Ambulatory Visit (INDEPENDENT_AMBULATORY_CARE_PROVIDER_SITE_OTHER): Payer: 59 | Admitting: Family Medicine

## 2021-02-20 ENCOUNTER — Other Ambulatory Visit: Payer: Self-pay

## 2021-02-20 ENCOUNTER — Encounter: Payer: Self-pay | Admitting: Family Medicine

## 2021-02-20 VITALS — BP 110/74 | HR 77 | Temp 98.1°F | Ht 71.0 in | Wt 208.2 lb

## 2021-02-20 DIAGNOSIS — R7989 Other specified abnormal findings of blood chemistry: Secondary | ICD-10-CM

## 2021-02-20 DIAGNOSIS — R739 Hyperglycemia, unspecified: Secondary | ICD-10-CM

## 2021-02-20 DIAGNOSIS — Z0001 Encounter for general adult medical examination with abnormal findings: Secondary | ICD-10-CM | POA: Diagnosis not present

## 2021-02-20 DIAGNOSIS — E785 Hyperlipidemia, unspecified: Secondary | ICD-10-CM | POA: Diagnosis not present

## 2021-02-20 DIAGNOSIS — G40309 Generalized idiopathic epilepsy and epileptic syndromes, not intractable, without status epilepticus: Secondary | ICD-10-CM

## 2021-02-20 DIAGNOSIS — M109 Gout, unspecified: Secondary | ICD-10-CM

## 2021-02-20 DIAGNOSIS — N529 Male erectile dysfunction, unspecified: Secondary | ICD-10-CM

## 2021-02-20 DIAGNOSIS — E663 Overweight: Secondary | ICD-10-CM

## 2021-02-20 LAB — HEMOGLOBIN A1C: Hgb A1c MFr Bld: 5.6 % (ref 4.6–6.5)

## 2021-02-20 LAB — COMPREHENSIVE METABOLIC PANEL WITH GFR
ALT: 24 U/L (ref 0–53)
AST: 20 U/L (ref 0–37)
Albumin: 4.4 g/dL (ref 3.5–5.2)
Alkaline Phosphatase: 65 U/L (ref 39–117)
BUN: 14 mg/dL (ref 6–23)
CO2: 23 meq/L (ref 19–32)
Calcium: 9.4 mg/dL (ref 8.4–10.5)
Chloride: 106 meq/L (ref 96–112)
Creatinine, Ser: 1.09 mg/dL (ref 0.40–1.50)
GFR: 83.91 mL/min
Glucose, Bld: 105 mg/dL — ABNORMAL HIGH (ref 70–99)
Potassium: 3.8 meq/L (ref 3.5–5.1)
Sodium: 137 meq/L (ref 135–145)
Total Bilirubin: 1.2 mg/dL (ref 0.2–1.2)
Total Protein: 6.9 g/dL (ref 6.0–8.3)

## 2021-02-20 LAB — LIPID PANEL
Cholesterol: 183 mg/dL (ref 0–200)
HDL: 40.6 mg/dL (ref >39.00)
LDL Cholesterol: 111 mg/dL — ABNORMAL HIGH (ref 0–99)
NonHDL: 142.62
Total CHOL/HDL Ratio: 5
Triglycerides: 157 mg/dL — ABNORMAL HIGH (ref 0.0–149.0)
VLDL: 31.4 mg/dL (ref 0.0–40.0)

## 2021-02-20 LAB — URIC ACID: Uric Acid, Serum: 4.1 mg/dL (ref 4.0–7.8)

## 2021-02-20 LAB — CBC
HCT: 45.9 % (ref 39.0–52.0)
Hemoglobin: 15.4 g/dL (ref 13.0–17.0)
MCHC: 33.6 g/dL (ref 30.0–36.0)
MCV: 93.1 fl (ref 78.0–100.0)
Platelets: 213 10*3/uL (ref 150.0–400.0)
RBC: 4.93 Mil/uL (ref 4.22–5.81)
RDW: 12.5 % (ref 11.5–15.5)
WBC: 5.6 10*3/uL (ref 4.0–10.5)

## 2021-02-20 LAB — TSH: TSH: 1.57 u[IU]/mL (ref 0.35–5.50)

## 2021-02-20 MED ORDER — SILDENAFIL CITRATE 100 MG PO TABS: 50.0000 mg | ORAL_TABLET | Freq: Every day | ORAL | 3 refills | Status: DC | PRN

## 2021-02-20 NOTE — Patient Instructions (Addendum)
It was very nice to see you today!  We will check blood work today.  I will refill your medications.  Keep up the good work with diet and exercise.  We will see you back in 1 year for your next physical.  Please come back sooner if needed.  Take care, Dr Jimmey Ralph  PLEASE NOTE:  If you had any lab tests please let us know if you have not heard back within a few days. You may see your results on mychart before we have a chance to review them but we will give you a call once they are reviewed by [place]. If we ordered any referrals today, please let us know if you have not heard from their office within the next week.   Please try these tips to maintain a healthy lifestyle:  Eat at least 3 REAL meals and 1-2 snacks per day.  Aim for no more than 5 hours between eating.  If you eat breakfast, please do so within one hour of getting up.   Each meal should contain half fruits/vegetables, one quarter protein, and one quarter carbs (no bigger than a computer mouse)  Cut down on sweet beverages. This includes juice, soda, and sweet tea.   Drink at least 1 glass of water with each meal and aim for at least 8 glasses per day  Exercise at least 150 minutes every week.    Preventive Care 49-93 Years Old, Male Preventive care refers to lifestyle choices and visits with your health care provider that can promote health and wellness. This includes: A yearly physical exam. This is also called an annual wellness visit. Regular dental and eye exams. Immunizations. Screening for certain conditions. Healthy lifestyle choices, such as: Eating a healthy diet. Getting regular exercise. Not using drugs or products that contain nicotine and tobacco. Limiting alcohol use. What can I expect for my preventive care visit? Physical exam Your health care provider will check your: Height and weight. These may be used to calculate your BMI (body mass index). BMI is a measurement that tells if you are at a healthy  weight. Heart rate and blood pressure. Body temperature. Skin for abnormal spots. Counseling Your health care provider may ask you questions about your: Past medical problems. Family's medical history. Alcohol, tobacco, and drug use. Emotional well-being. Home life and relationship well-being. Sexual activity. Diet, exercise, and sleep habits. Work and work Astronomer. Access to firearms. What immunizations do I need? Vaccines are usually given at various ages, according to a schedule. Your health care provider will recommend vaccines for you based on your age, medical history, and lifestyle or other factors, such as travel or where you work. What tests do I need? Blood tests Lipid and cholesterol levels. These may be checked every 5 years, or more often if you are over 84 years old. Hepatitis C test. Hepatitis B test. Screening Lung cancer screening. You may have this screening every year starting at age 57 if you have a 30-pack-year history of smoking and currently smoke or have quit within the past 15 years. Prostate cancer screening. Recommendations will vary depending on your family history and other risks. Genital exam to check for testicular cancer or hernias. Colorectal cancer screening. All adults should have this screening starting at age 40 and continuing until age 68. Your health care provider may recommend screening at age 58 if you are at increased risk. You will have tests every 1-10 years, depending on your results and the type of  screening test. Diabetes screening. This is done by checking your blood sugar (glucose) after you have not eaten for a while (fasting). You may have this done every 1-3 years. STD (sexually transmitted disease) testing, if you are at risk. Follow these instructions at home: Eating and drinking  Eat a diet that includes fresh fruits and vegetables, whole grains, lean protein, and low-fat dairy products. Take vitamin and mineral  supplements as recommended by your health care provider. Do not drink alcohol if your health care provider tells you not to drink. If you drink alcohol: Limit how much you have to 0-2 drinks a day. Be aware of how much alcohol is in your drink. In the [place] one drink equals one 12 oz bottle of beer (355 mL), one 5 oz glass of wine (148 mL), or one 1 oz glass of hard liquor (44 mL). Lifestyle Take daily care of your teeth and gums. Brush your teeth every morning and night with fluoride toothpaste. Floss one time each day. Stay active. Exercise for at least 30 minutes 5 or more days each week. Do not use any products that contain nicotine or tobacco, such as cigarettes, e-cigarettes, and chewing tobacco. If you need help quitting, ask your health care provider. Do not use drugs. If you are sexually active, practice safe sex. Use a condom or other form of protection to prevent STIs (sexually transmitted infections). If told by your health care provider, take low-dose aspirin daily starting at age 6. Find healthy ways to cope with stress, such as: Meditation, yoga, or listening to music. Journaling. Talking to a trusted person. Spending time with friends and family. Safety Always wear your seat belt while driving or riding in a vehicle. Do not drive: If you have been drinking alcohol. Do not ride with someone who has been drinking. When you are tired or distracted. While texting. Wear a helmet and other protective equipment during sports activities. If you have firearms in your house, make sure you follow all gun safety procedures. What's next? Go to your health care provider once a year for an annual wellness visit. Ask your health care provider how often you should have your eyes and teeth checked. Stay up to date on all vaccines. This information is not intended to replace advice given to you by your health care provider. Make sure you discuss any questions you have with your health  care provider. Document Revised: 06/24/2020 Document Reviewed: 04/10/2018 Elsevier Patient Education  2022 [employer].

## 2021-02-20 NOTE — Assessment & Plan Note (Signed)
Check CMET. 

## 2021-02-20 NOTE — Assessment & Plan Note (Signed)
Follows with neurology.  On Keppra 1500 mgTwice daily and zonisamide 200mg  bid.

## 2021-02-20 NOTE — Assessment & Plan Note (Signed)
Stable.  Has had sildenafil in the past and has done well with this.  No significant side effects.  Will refill today.

## 2021-02-20 NOTE — Assessment & Plan Note (Signed)
Check lipids 

## 2021-02-20 NOTE — Assessment & Plan Note (Signed)
Uric acid.  On allopurinol 300 mg daily.  Had 1 mild flare several months ago but nothing since.

## 2021-02-20 NOTE — Progress Notes (Signed)
Chief Complaint:  Glenn Rosario is a 40 y.o. male who presents today for his annual comprehensive physical exam.    Assessment/Plan:  Chronic Problems Addressed Today: No problem-specific Assessment & Plan notes found for this encounter.   Body mass index is 31.21 kg/m. / Overweight    Preventative Healthcare: Flu shot deferred. Check labs. Due for colon cancer screening at age 87.   Patient Counseling(The following topics were reviewed and/or handout was given):  -Nutrition: Stressed importance of moderation in sodium/caffeine intake, saturated fat and cholesterol, caloric balance, sufficient intake of fresh fruits, vegetables, and fiber.  -Stressed the importance of regular exercise.   -Substance Abuse: Discussed cessation/primary prevention of tobacco, alcohol, or other drug use; driving or other dangerous activities under the influence; availability of treatment for abuse.   -Injury prevention: Discussed safety belts, safety helmets, smoke detector, smoking near bedding or upholstery.   -Sexuality: Discussed sexually transmitted diseases, partner selection, use of condoms, avoidance of unintended pregnancy and contraceptive alternatives.   -Dental health: Discussed importance of regular tooth brushing, flossing, and dental visits.  -Health maintenance and immunizations reviewed. Please refer to Health maintenance section.  Return to care in 1 year for next preventative visit.     Subjective:  HPI:  He has no acute complaints today.   Lifestyle Diet: None specific.  Exercise: Stationary bike daily.   Depression screen [facility] 2/9 02/19/2020  Decreased Interest 0  Down, Depressed, Hopeless 0  PHQ - 2 Score 0  Altered sleeping 0  Tired, decreased energy 1  Change in appetite 0  Feeling bad or failure about yourself  0  Trouble concentrating 0  Moving slowly or fidgety/restless 0  Suicidal thoughts 0  PHQ-9 Score 1  Difficult doing work/chores Not difficult at all     Health Maintenance Due  Topic Date Due   COVID-19 Vaccine (1) Never done   HIV Screening  Never done   Hepatitis C Screening  Never done   TETANUS/TDAP  Never done   INFLUENZA VACCINE  Never done     ROS: Per HPI, otherwise a complete review of systems was negative.   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   Encounter for therapeutic drug monitoring 10/15/2016   Generalized convulsive epilepsy (HCC) 12/22/2012   Essential tremor 12/22/2012   Past Surgical History:  Procedure Laterality Date   None      Family History  Problem Relation Age of Onset   Healthy Mother    Pulmonary Hypertension Mother    Hearing loss Mother    Healthy Father    Heart attack Father    Arthritis Brother    Hearing loss Brother    Hyperlipidemia Brother    Arthritis Brother    Hearing loss Brother     Medications- reviewed and updated Current Outpatient Medications  Medication Sig Dispense Refill   allopurinol (ZYLOPRIM) 300 MG tablet TAKE 1 TABLET BY MOUTH  DAILY 90 tablet 3   Ascorbic Acid (VITAMIN C) 100 MG tablet Take 100 mg by mouth daily.     Calcium Ascorbate (C-500 NON-ACID) 500 MG TABS Take 500 mg by mouth daily.  Cholecalciferol (D 400 PO) Take 400 mg by mouth daily.     levETIRAcetam (KEPPRA) 750 MG tablet TAKE 2 TABLETS BY MOUTH TWO TIMES DAILY 360 tablet 3   zonisamide (ZONEGRAN) 100 MG capsule TAKE 2 CAPSULES BY MOUTH  TWO TIMES DAILY 360 capsule 3   No current facility-administered medications for this visit.    Allergies-reviewed and updated Allergies  Allergen Reactions   Depakote [Divalproex Sodium]    Klonopin [Clonazepam]    Nsaids     Tegretol [Carbamazepine]     Social History   Socioeconomic History   Marital status: Single    Spouse name: Not on file   Number of children: 0   Years of education: 12+   Highest education level: Not on file  Occupational History    Employer: [employer]  Tobacco Use   Smoking status: Never   Smokeless tobacco: Never  Vaping Use   Vaping Use: Never used  Substance and Sexual Activity   Alcohol use: Yes    Alcohol/week: 5.0 standard drinks    Types: 5 Standard drinks or equivalent per week   Drug use: No   Sexual activity: Yes  Other Topics Concern   Not on file  Social History Narrative   Patient is single and lives alone.    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    Patient drinks some caffeine.       Social Determinants of Health   Financial Resource Strain: Not on file  Food Insecurity: Not on file  Transportation Needs: Not on file  Physical Activity: Not on file  Stress: Not on file  Social Connections: Not on file        Objective:  Physical Exam: Ht 5\' 11"  (1.803 m)   BMI 31.21 kg/m   Body mass index is 31.21 kg/m. Wt Readings from Last 3 Encounters:  02/19/20 223 lb 12.8 oz (101.5 kg)  10/09/19 246 lb (111.6 kg)  10/22/18 252 lb 12.8 oz (114.7 kg)   Gen: NAD, resting comfortably HEENT: TMs normal bilaterally. OP clear. No thyromegaly noted.  CV: RRR with no murmurs appreciated Pulm: NWOB, CTAB with no crackles, wheezes, or rhonchi GI: Normal bowel sounds present. Soft, Nontender, Nondistended. MSK: no edema, cyanosis, or clubbing noted Skin: warm, dry Neuro: CN2-12 grossly intact. Strength 5/5 in upper and lower extremities. Reflexes symmetric and intact bilaterally.  Psych: Normal affect and thought content     Glenn Rosario M. 10/24/18, MD 02/20/2021 8:08 AM

## 2021-02-21 NOTE — Progress Notes (Signed)
Please inform patient of the following:  Cholesterol is borderline but stable. Everything else is in acceptable ranges. Would like for him to keep up the good work and we can recheck in a year or so.  Katina Degree. Jimmey Ralph, MD 02/21/2021 8:10 AM

## 2021-02-23 ENCOUNTER — Telehealth: Payer: Self-pay | Admitting: *Deleted

## 2021-02-23 NOTE — Telephone Encounter (Signed)
Physician results form faxed to [phone]

## 2021-06-09 ENCOUNTER — Other Ambulatory Visit: Payer: Self-pay | Admitting: Family Medicine

## 2021-08-21 ENCOUNTER — Telehealth: Payer: Self-pay | Admitting: Family Medicine

## 2021-08-21 NOTE — Telephone Encounter (Signed)
LVM and sent MyChart message informing pt of r/s needed for 6/12 appointment- Amy on vacation. ?

## 2021-09-20 ENCOUNTER — Ambulatory Visit (INDEPENDENT_AMBULATORY_CARE_PROVIDER_SITE_OTHER): Payer: 59 | Admitting: Family Medicine

## 2021-09-20 ENCOUNTER — Encounter: Payer: Self-pay | Admitting: Family Medicine

## 2021-09-20 VITALS — BP 117/75 | HR 69 | Ht 71.0 in | Wt 212.5 lb

## 2021-09-20 DIAGNOSIS — G40309 Generalized idiopathic epilepsy and epileptic syndromes, not intractable, without status epilepticus: Secondary | ICD-10-CM | POA: Diagnosis not present

## 2021-09-20 MED ORDER — ZONISAMIDE 100 MG PO CAPS: ORAL_CAPSULE | ORAL | 3 refills | Status: DC

## 2021-09-20 MED ORDER — LEVETIRACETAM 750 MG PO TABS: ORAL_TABLET | ORAL | 3 refills | Status: DC

## 2021-09-20 NOTE — Patient Instructions (Signed)
Below is our plan:  We will continue levetiracetam 1500mg  and zonisamide 200mg  twice daily.   Please make sure you are consistent with timing of seizure medication. I recommend annual visit with primary care provider (PCP) for complete physical and routine blood work. We will monitor vitamin D level. I recommend daily intake of vitamin D (400-800iu) and calcium (800-1000mg ) for bone health. Discuss Dexa screening with PCP.   According to [place] law, you can not drive unless you are seizure / syncope free for at least 6 months and under physician's care.  Please maintain precautions. Do not participate in activities where a loss of awareness could harm you or someone else. No swimming alone, no tub bathing, no hot tubs, no driving, no operating motorized vehicles (cars, ATVs, motocycles, etc), lawnmowers, power tools or firearms. No standing at heights, such as rooftops, ladders or stairs. Avoid hot objects such as stoves, heaters, open fires. Wear a helmet when riding a bicycle, scooter, skateboard, etc. and avoid areas of traffic. Set your water heater to 120 degrees or less.    Please make sure you are staying well hydrated. I recommend 50-60 ounces daily. Well balanced diet and regular exercise encouraged. Consistent sleep schedule with 6-8 hours recommended.   Please continue follow up with care team as directed.   Follow up with me in 1 year   You may receive a survey regarding today's visit. I encourage you to leave honest feed back as I do use this information to improve patient care. Thank you for seeing me today!

## 2021-09-20 NOTE — Progress Notes (Signed)
PATIENT: Glenn Rosario DOB: 19-Mar-1981  REASON FOR VISIT: follow up HISTORY FROM: patient  Chief Complaint  Patient presents with   Follow-up    Rm 10, alone. Here for yearly sz f/u. Pt reports no sz like activity since last visit. No concerns.     HISTORY OF PRESENT ILLNESS:  09/20/21 ALL (office): [name] returns for follow up for seizures. He continues levetiracetam 1500mg  and zonisamide 200mg  BID. He is tolerating medications well. No seizure activity. Last known seizure in 2009. He is followed regularly by PCP. Labs reviewed in Epic were unremarkable.   10/17/20 ALL (Mychart): Glenn Rosario returns for follow up for seizures. He continues levetiracetam 1500mg  BID and zonisamide 200mg  BID. He is tolerating medications well. He denies seizure activity since last being seen. He occasionally has intermittent tremors. He is feeling well, today, and without concerns.    10/22/2019 ALL (Mychart): Glenn Rosario is a 41 y.o. male here today for follow up for seizure. He is doing well. No recent seizure activity. He is tolerating levetiracetam 1500mg  BID and zonisamide 200mg  BID. No concerns today.   10/22/2018 ALL (office): Glenn Rosario is a 41 y.o. male here today for follow up for seizure.  He continues levetiracetam 1500 mg twice daily as well as Zonegran 200 mg twice daily.  He is tolerating these medications well and without obvious adverse effects.  He denies any seizure activity.  He reports last seizure was at least 5 years ago.  He does continue to have an essential tremor.  He reports that tremor is much worse during times of stress.    It was noted at his last visit that his creatinine has been elevated for a couple of years.  He was advised to establish care with a primary care provider.  He has not yet done so.  He does admit to regular NSAID use.  He alternates between 800 mg of ibuprofen and 440 mg of Aleve.  He takes 1 or the other on a daily basis for knee pain.  He also drinks diet Dr.  46 daily.  There is no known family history of kidney disease.  No known elevated blood pressures.  HISTORY: (copied from Dr note on 10/21/2017)  HPI:  Glenn Rosario is a 40 y.o. male here as a referral from Dr. No ref. provider found for epilepsy.  I am seeing patient for the first time today.  He has generalized convulsive epilepsy and essential tremor.  Per review of notes he has a history of primary generalized myoclonic epilepsy.  His seizure disorder began in 1995.  He currently takes Keppra 1500 mg twice daily and Zonegran 200 mg twice daily.  He has some rare muscle twitching.  He denies any double vision, loss of vision speech or swallowing problems.  In the past stress and lack of sleep can cause seizures.  Has tried Klonopin, Dilantin and Tegretol in the past with side effects.  DEXA scan done in 2006 showed evidence of osteopenia.  He also had difficulty with weight gain and daytime sleepiness and tremor.  Keppra was added in 2007 and he was tapered off Depakote.  He did not have seizures for 2 years until January 2009 at that time it appears Zonegran was possibly added. No seizures since last being seen. Well controlled on medication. Compliant.    Meds tried: Dilantin, Tegretol, Depakote, Klonopin on Keppra and zonisamide.   Reviewed notes, labs and imaging from outside physicians, which showed: see above  CT head 2002 which was normal by report   REVIEW OF SYSTEMS: Out of a complete 14 system review of symptoms, the patient complains only of the following symptoms, none and all other reviewed systems are negative.  ALLERGIES: Allergies  Allergen Reactions   Depakote [Divalproex Sodium]    Klonopin [Clonazepam]    Nsaids    Tegretol [Carbamazepine]     HOME MEDICATIONS: Outpatient Medications Prior to Visit  Medication Sig Dispense Refill   allopurinol (ZYLOPRIM) 300 MG tablet TAKE 1 TABLET BY MOUTH  DAILY 90 tablet 3   Ascorbic Acid (VITAMIN C) 100 MG tablet Take  100 mg by mouth daily.     Calcium Ascorbate 500 MG TABS Take 500 mg by mouth daily.      Cholecalciferol (D 400 PO) Take 400 mg by mouth daily.     levETIRAcetam (KEPPRA) 750 MG tablet TAKE 2 TABLETS BY MOUTH TWO TIMES DAILY 360 tablet 3   sildenafil (VIAGRA) 100 MG tablet Take 0.5-1 tablets (50-100 mg total) by mouth daily as needed for erectile dysfunction. 15 tablet 3   zonisamide (ZONEGRAN) 100 MG capsule TAKE 2 CAPSULES BY MOUTH  TWO TIMES DAILY 360 capsule 3   No facility-administered medications prior to visit.    PAST MEDICAL HISTORY: Past Medical History:  Diagnosis Date   Anxiety    Osteopenia    Seizure (HCC)     PAST SURGICAL HISTORY: Past Surgical History:  Procedure Laterality Date   None      FAMILY HISTORY: Family History  Problem Relation Age of Onset   Healthy Mother    Pulmonary Hypertension Mother    Hearing loss Mother    Healthy Father    Heart attack Father    Arthritis Brother    Hearing loss Brother    Hyperlipidemia Brother    Arthritis Brother    Hearing loss Brother     SOCIAL HISTORY: Social History   Socioeconomic History   Marital status: Single    Spouse name: Not on file   Number of children: 0   Years of education: 12+   Highest education level: Not on file  Occupational History    Employer: [employer]  Tobacco Use   Smoking status: Never   Smokeless tobacco: Never  Vaping Use   Vaping Use: Never used  Substance and Sexual Activity   Alcohol use: Yes    Alcohol/week: 5.0 standard drinks    Types: 5 Standard drinks or equivalent per week   Drug use: No   Sexual activity: Yes  Other Topics Concern   Not on file  Social History Narrative   Patient is single and lives alone.    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    Patient drinks some caffeine.       Social Determinants of Health   Financial Resource Strain: Not on file  Food Insecurity: Not on file  Transportation Needs: Not on file  Physical  Activity: Not on file  Stress: Not on file  Social Connections: Not on file  Intimate Partner Violence: Not on file      PHYSICAL EXAM  Vitals:   09/20/21 1352  BP: 117/75  Pulse: 69  Weight: 212 lb 8 oz (96.4 kg)  Height: 5\' 11"  (1.803 m)    Body mass index is 29.64 kg/m.  Generalized: Well developed, in no acute distress  Cardiology: normal rate and rhythm, no murmur noted Neurological examination  Mentation: Alert oriented to time, place, history  taking. Follows all commands speech and language fluent Cranial nerve II-XII: Pupils were equal round reactive to light. Extraocular movements were full, visual field were full on confrontational test. Facial sensation and strength were normal. Uvula tongue midline. Head turning and shoulder shrug  were normal and symmetric. Motor: The motor testing reveals 5 over 5 strength of all 4 extremities. Good symmetric motor tone is noted throughout.   Coordination: Cerebellar testing reveals good finger-nose-finger bilaterally.  Gait and station: Gait is normal. Tandem gait is normal.    DIAGNOSTIC DATA (LABS, IMAGING, TESTING) - I reviewed patient records, labs, notes, testing and imaging myself where available.      View : No data to display.           Lab Results  Component Value Date   WBC 5.6 02/20/2021   HGB 15.4 02/20/2021   HCT 45.9 02/20/2021   MCV 93.1 02/20/2021   PLT 213.0 02/20/2021      Component Value Date/Time   NA 137 02/20/2021 0832   NA 141 10/22/2018 0820   K 3.8 02/20/2021 0832   CL 106 02/20/2021 0832   CO2 23 02/20/2021 0832   GLUCOSE 105 (H) 02/20/2021 0832   BUN 14 02/20/2021 0832   BUN 10 10/22/2018 0820   CREATININE 1.09 02/20/2021 0832   CREATININE 1.22 02/19/2020 0957   CALCIUM 9.4 02/20/2021 0832   PROT 6.9 02/20/2021 0832   PROT 7.3 10/22/2018 0820   ALBUMIN 4.4 02/20/2021 0832   ALBUMIN 4.6 10/22/2018 0820   AST 20 02/20/2021 0832   ALT 24 02/20/2021 0832   ALKPHOS 65 02/20/2021  0832   BILITOT 1.2 02/20/2021 0832   BILITOT 0.8 10/22/2018 0820   GFRNONAA 58 (L) 10/22/2018 0820   GFRAA 67 10/22/2018 0820   Lab Results  Component Value Date   CHOL 183 02/20/2021   HDL 40.60 02/20/2021   LDLCALC 111 (H) 02/20/2021   TRIG 157.0 (H) 02/20/2021   CHOLHDL 5 02/20/2021   Lab Results  Component Value Date   HGBA1C 5.6 02/20/2021   No results found for: [id number] Lab Results  Component Value Date   TSH 1.57 02/20/2021    ASSESSMENT AND PLAN 41 y.o. year old male  has a past medical history of Anxiety, Osteopenia, and Seizure (HCC). here with     ICD-10-CM   1. Generalized convulsive epilepsy (HCC)  G40.309        Minerva Areolaric continues to do well from a seizure perspective with levetiracetam 1500 mg twice daily and Zonegran 200 mg twice daily.  No recent seizure activity.  Last seizure documented was in 2009. He will continue healthy lifestyle habits. Creatinine now normal. No longer taking Nsaids. He will continue close follow up with PCP. He may continues refills with PCP if willing, otherwise, follow up with me annually. He verbalizes understanding and agreement with this plan.   No orders of the defined types were placed in this encounter.    No orders of the defined types were placed in this encounter.      Shawnie Dappermy Kathlene Yano, FNP-C 09/20/2021, 1:59 PM [facility] [address], [address] [place], [place] [address] [phone]

## 2021-09-21 ENCOUNTER — Encounter: Payer: Self-pay | Admitting: Family Medicine

## 2021-09-21 ENCOUNTER — Ambulatory Visit (INDEPENDENT_AMBULATORY_CARE_PROVIDER_SITE_OTHER): Payer: 59 | Admitting: Family Medicine

## 2021-09-21 VITALS — BP 117/83 | HR 102 | Temp 98.3°F | Ht 71.0 in | Wt 211.2 lb

## 2021-09-21 DIAGNOSIS — G40B09 Juvenile myoclonic epilepsy, not intractable, without status epilepticus: Secondary | ICD-10-CM

## 2021-09-21 DIAGNOSIS — G40309 Generalized idiopathic epilepsy and epileptic syndromes, not intractable, without status epilepticus: Secondary | ICD-10-CM

## 2021-09-21 DIAGNOSIS — M109 Gout, unspecified: Secondary | ICD-10-CM

## 2021-09-21 DIAGNOSIS — L820 Inflamed seborrheic keratosis: Secondary | ICD-10-CM

## 2021-09-21 NOTE — Assessment & Plan Note (Signed)
Doing well on allopurinol 300 mg daily.  Does not need refill today.

## 2021-09-21 NOTE — Progress Notes (Signed)
   Glenn Rosario is a 41 y.o. male who presents today for an office visit.  Assessment/Plan:  New/Acute Problems: Inflamed seborrheic keratosis Cryotherapy applied today.  He tolerated well.  See below procedure note.  Chronic Problems Addressed Today: Generalized convulsive epilepsy Symptoms are very well controlled.  He asked [place] to take over prescriptions.  Given his good control with neurology for the past several years this would be reasonable.  We will continue Keppra 1500 mg twice daily and zonisamide 200 mg twice daily.  He will let us know when he needs refill.  Gout Doing well on allopurinol 300 mg daily.  Does not need refill today.     Subjective:  HPI:  See A/P for status of chronic conditions.  He has noticed a lesion in the middle of his back for the last several weeks.  The area is irritated and tender to palpation.  He is not sure how long it has been there.  No bleeding.  No treatments tried.       Objective:  Physical Exam: BP 117/83   Pulse (!) 102   Temp 98.3 F (36.8 C) (Temporal)   Ht 5\' 11"  (1.803 m)   Wt 211 lb 3.2 oz (95.8 kg)   SpO2 98%   BMI 29.46 kg/m   Gen: No acute distress, resting comfortably CV: Regular rate and rhythm with no murmurs appreciated Pulm: Normal work of breathing, clear to auscultation bilaterally with no crackles, wheezes, or rhonchi Skin: Inflamed approximately 1 cm tan-colored waxy raised lesion with surrounding erythema on left mid back. Neuro: Grossly normal, moves all extremities Psych: Normal affect and thought content  Cryotherapy Procedure Note  Pre-operative Diagnosis: Inflamed seborrheic keratosis  Locations: Left Mid back  Indications: Therapeutic  Procedure Details  Patient informed of risks (permanent scarring, infection, light or dark discoloration, bleeding, infection, weakness, numbness and recurrence of the lesion) and benefits of the procedure and verbal informed consent obtained.  The areas are  treated with liquid nitrogen therapy, frozen until ice ball extended 3 mm beyond lesion, allowed to thaw, and treated again. The patient tolerated procedure well.  The patient was instructed on post-op care, warned that there may be blister formation, redness and pain. Recommend OTC analgesia as needed for pain.  Condition: Stable  Complications: none.       . Katina Degree, MD 09/21/2021 3:23 PM

## 2021-09-21 NOTE — Assessment & Plan Note (Signed)
Symptoms are very well controlled.  He asked [place] to take over prescriptions.  Given his good control with neurology for the past several years this would be reasonable.  We will continue Keppra 1500 mg twice daily and zonisamide 200 mg twice daily.  He will let us know when he needs refill.

## 2021-09-21 NOTE — Patient Instructions (Signed)
It was very nice to see you today!  You have an inflamed seborrheic keratosis.  This is benign.  We froze a spot today.  Please let me know when you use to refill your other medications.  I will see you back in October for your annual physical.  Come back sooner if needed.  Take care, Dr Jimmey Ralph  PLEASE NOTE:  If you had any lab tests please let us know if you have not heard back within a few days. You may see your results on mychart before we have a chance to review them but we will give you a call once they are reviewed by [place]. If we ordered any referrals today, please let us know if you have not heard from their office within the next week.   Please try these tips to maintain a healthy lifestyle:  Eat at least 3 REAL meals and 1-2 snacks per day.  Aim for no more than 5 hours between eating.  If you eat breakfast, please do so within one hour of getting up.   Each meal should contain half fruits/vegetables, one quarter protein, and one quarter carbs (no bigger than a computer mouse)  Cut down on sweet beverages. This includes juice, soda, and sweet tea.   Drink at least 1 glass of water with each meal and aim for at least 8 glasses per day  Exercise at least 150 minutes every week.    Seborrheic Keratosis A seborrheic keratosis is a common, noncancerous (benign) skin growth. These growths are velvety, waxy, rough, tan, brown, or black spots that appear on the skin. These skin growths can be flat or raised, and scaly. What are the causes? The cause of this condition is not known. What increases the risk? You are more likely to develop this condition if you: Have a family history of seborrheic keratosis. Are 50 or older. Are pregnant. Have had estrogen replacement therapy. What are the signs or symptoms? Symptoms of this condition include growths on the face, chest, shoulders, back, or other areas. These growths: Are usually painless, but may become irritated and itchy. Can be  yellow, brown, black, or other colors. Are slightly raised or have a flat surface. Are sometimes rough or wart-like in texture. Are often velvety or waxy on the surface. Are round or oval-shaped. Often occur in groups, but may occur as a single growth. How is this diagnosed? This condition is diagnosed with a medical history and physical exam. A sample of the growth may be tested (skin biopsy). You may need to see a skin specialist (dermatologist). How is this treated? Treatment is not usually needed for this condition, unless the growths are irritated or bleed often. You may also choose to have the growths removed if you do not like their appearance. Most commonly, these growths are treated with a procedure in which liquid nitrogen is applied to "freeze" off the growth (cryosurgery). They may also be burned off with electricity (electrocautery) or removed by scraping (curettage). Follow these instructions at home: Watch your growth for any changes. Keep all follow-up visits as told by your health care provider. This is important. Do not scratch or pick at the growth or growths. This can cause them to become irritated or infected. Contact a health care provider if: You suddenly have many new growths. Your growth bleeds, itches, or hurts. Your growth suddenly becomes larger or changes color. Summary A seborrheic keratosis is a common, noncancerous (benign) skin growth. Treatment is not usually needed  for this condition, unless the growths are irritated or bleed often. Watch your growth for any changes. Contact a health care provider if you suddenly have many new growths or your growth suddenly becomes larger or changes color. Keep all follow-up visits as told by your health care provider. This is important. This information is not intended to replace advice given to you by your health care provider. Make sure you discuss any questions you have with your health care provider. Document  Revised: 02/08/2021 Document Reviewed: 02/08/2021 Elsevier Patient Education  2023 [employer].

## 2021-09-26 ENCOUNTER — Ambulatory Visit: Payer: 59 | Admitting: Family Medicine

## 2021-10-09 ENCOUNTER — Ambulatory Visit: Payer: 59 | Admitting: Family Medicine

## 2021-11-15 ENCOUNTER — Ambulatory Visit: Payer: 59 | Admitting: Family Medicine

## 2021-12-12 ENCOUNTER — Telehealth: Payer: Self-pay | Admitting: Family Medicine

## 2021-12-12 NOTE — Telephone Encounter (Signed)
.  Type of form received: FMLA  Additional comments: pt requests a MyChart msg when completed.   Received by: Lorene Dy  Form should be Faxed to: [phone]  Form should be mailed to:    Is patient requesting call for pickup:   Form placed:  In provider's box  Attach charge sheet. yes  Individual made aware of 3-5 business day turn around (Y/N)?  yes

## 2021-12-15 NOTE — Telephone Encounter (Signed)
FMLA placed on PCP office to be sign

## 2021-12-16 NOTE — Telephone Encounter (Signed)
FMLA faxed on 12/15/2021

## 2022-01-22 ENCOUNTER — Encounter: Payer: Self-pay | Admitting: *Deleted

## 2022-02-26 ENCOUNTER — Encounter: Payer: Self-pay | Admitting: Family Medicine

## 2022-02-26 ENCOUNTER — Ambulatory Visit (INDEPENDENT_AMBULATORY_CARE_PROVIDER_SITE_OTHER): Payer: 59 | Admitting: Family Medicine

## 2022-02-26 VITALS — BP 113/76 | HR 71 | Temp 97.8°F | Ht 71.0 in | Wt 212.6 lb

## 2022-02-26 DIAGNOSIS — G40B09 Juvenile myoclonic epilepsy, not intractable, without status epilepticus: Secondary | ICD-10-CM

## 2022-02-26 DIAGNOSIS — Z0001 Encounter for general adult medical examination with abnormal findings: Secondary | ICD-10-CM

## 2022-02-26 DIAGNOSIS — M109 Gout, unspecified: Secondary | ICD-10-CM

## 2022-02-26 DIAGNOSIS — Z23 Encounter for immunization: Secondary | ICD-10-CM

## 2022-02-26 DIAGNOSIS — E785 Hyperlipidemia, unspecified: Secondary | ICD-10-CM | POA: Diagnosis not present

## 2022-02-26 DIAGNOSIS — R739 Hyperglycemia, unspecified: Secondary | ICD-10-CM

## 2022-02-26 DIAGNOSIS — G40309 Generalized idiopathic epilepsy and epileptic syndromes, not intractable, without status epilepticus: Secondary | ICD-10-CM

## 2022-02-26 LAB — COMPREHENSIVE METABOLIC PANEL
ALT: 18 U/L (ref 0–53)
AST: 20 U/L (ref 0–37)
Albumin: 4.4 g/dL (ref 3.5–5.2)
Alkaline Phosphatase: 54 U/L (ref 39–117)
BUN: 14 mg/dL (ref 6–23)
CO2: 23 mEq/L (ref 19–32)
Calcium: 9.3 mg/dL (ref 8.4–10.5)
Chloride: 105 mEq/L (ref 96–112)
Creatinine, Ser: 1.1 mg/dL (ref 0.40–1.50)
GFR: 82.41 mL/min (ref >60.00)
Glucose, Bld: 98 mg/dL (ref 70–99)
Potassium: 3.9 mEq/L (ref 3.5–5.1)
Sodium: 138 mEq/L (ref 135–145)
Total Bilirubin: 0.9 mg/dL (ref 0.2–1.2)
Total Protein: 6.9 g/dL (ref 6.0–8.3)

## 2022-02-26 LAB — CBC
HCT: 45.2 % (ref 39.0–52.0)
Hemoglobin: 15.3 g/dL (ref 13.0–17.0)
MCHC: 33.9 g/dL (ref 30.0–36.0)
MCV: 93.6 fl (ref 78.0–100.0)
Platelets: 172 10*3/uL (ref 150.0–400.0)
RBC: 4.83 Mil/uL (ref 4.22–5.81)
RDW: 12.9 % (ref 11.5–15.5)
WBC: 5 10*3/uL (ref 4.0–10.5)

## 2022-02-26 LAB — LIPID PANEL
Cholesterol: 186 mg/dL (ref 0–200)
HDL: 46.8 mg/dL (ref >39.00)
LDL Cholesterol: 110 mg/dL — ABNORMAL HIGH (ref 0–99)
NonHDL: 139.38
Total CHOL/HDL Ratio: 4
Triglycerides: 146 mg/dL (ref 0.0–149.0)
VLDL: 29.2 mg/dL (ref 0.0–40.0)

## 2022-02-26 LAB — TSH: TSH: 1.33 u[IU]/mL (ref 0.35–5.50)

## 2022-02-26 LAB — HEMOGLOBIN A1C: Hgb A1c MFr Bld: 5.5 % (ref 4.6–6.5)

## 2022-02-26 LAB — URIC ACID: Uric Acid, Serum: 6.3 mg/dL (ref 4.0–7.8)

## 2022-02-26 NOTE — Assessment & Plan Note (Signed)
No recent flares.  Check uric acid.  He is on allopurinol 300 mg daily.

## 2022-02-26 NOTE — Patient Instructions (Signed)
It was very nice to see you today!  We will check blood work today blood work today.  We will give you your flu shot.  Please continue to work on diet and exercise.  I will see back in year.  Please come back to see [place] sooner if needed.  Take care, Dr Jerline Pain  PLEASE NOTE:  If you had any lab tests please let us know if you have not heard back within a few days. You may see your results on mychart before we have a chance to review them but we will give you a call once they are reviewed by [place]. If we ordered any referrals today, please let us know if you have not heard from their office within the next week.   Please try these tips to maintain a healthy lifestyle:  Eat at least 3 REAL meals and 1-2 snacks per day.  Aim for no more than 5 hours between eating.  If you eat breakfast, please do so within one hour of getting up.   Each meal should contain half fruits/vegetables, one quarter protein, and one quarter carbs (no bigger than a computer mouse)  Cut down on sweet beverages. This includes juice, soda, and sweet tea.   Drink at least 1 glass of water with each meal and aim for at least 8 glasses per day  Exercise at least 150 minutes every week.    Preventive Care 49-24 Years Old, Male Preventive care refers to lifestyle choices and visits with your health care provider that can promote health and wellness. Preventive care visits are also called wellness exams. What can I expect for my preventive care visit? Counseling During your preventive care visit, your health care provider may ask about your: Medical history, including: Past medical problems. Family medical history. Current health, including: Emotional well-being. Home life and relationship well-being. Sexual activity. Lifestyle, including: Alcohol, nicotine or tobacco, and drug use. Access to firearms. Diet, exercise, and sleep habits. Safety issues such as seatbelt and bike helmet use. Sunscreen use. Work and  work Statistician. Physical exam Your health care provider will check your: Height and weight. These may be used to calculate your BMI (body mass index). BMI is a measurement that tells if you are at a healthy weight. Waist circumference. This measures the distance around your waistline. This measurement also tells if you are at a healthy weight and may help predict your risk of certain diseases, such as type 2 diabetes and high blood pressure. Heart rate and blood pressure. Body temperature. Skin for abnormal spots. What immunizations do I need?  Vaccines are usually given at various ages, according to a schedule. Your health care provider will recommend vaccines for you based on your age, medical history, and lifestyle or other factors, such as travel or where you work. What tests do I need? Screening Your health care provider may recommend screening tests for certain conditions. This may include: Lipid and cholesterol levels. Diabetes screening. This is done by checking your blood sugar (glucose) after you have not eaten for a while (fasting). Hepatitis B test. Hepatitis C test. HIV (human immunodeficiency virus) test. STI (sexually transmitted infection) testing, if you are at risk. Lung cancer screening. Prostate cancer screening. Colorectal cancer screening. Talk with your health care provider about your test results, treatment options, and if necessary, the need for more tests. Follow these instructions at home: Eating and drinking  Eat a diet that includes fresh fruits and vegetables, whole grains, lean protein, and  low-fat dairy products. Take vitamin and mineral supplements as recommended by your health care provider. Do not drink alcohol if your health care provider tells you not to drink. If you drink alcohol: Limit how much you have to 0-2 drinks a day. Know how much alcohol is in your drink. In the [place] one drink equals one 12 oz bottle of beer (355 mL), one 5 oz glass  of wine (148 mL), or one 1 oz glass of hard liquor (44 mL). Lifestyle Brush your teeth every morning and night with fluoride toothpaste. Floss one time each day. Exercise for at least 30 minutes 5 or more days each week. Do not use any products that contain nicotine or tobacco. These products include cigarettes, chewing tobacco, and vaping devices, such as e-cigarettes. If you need help quitting, ask your health care provider. Do not use drugs. If you are sexually active, practice safe sex. Use a condom or other form of protection to prevent STIs. Take aspirin only as told by your health care provider. Make sure that you understand how much to take and what form to take. Work with your health care provider to find out whether it is safe and beneficial for you to take aspirin daily. Find healthy ways to manage stress, such as: Meditation, yoga, or listening to music. Journaling. Talking to a trusted person. Spending time with friends and family. Minimize exposure to UV radiation to reduce your risk of skin cancer. Safety Always wear your seat belt while driving or riding in a vehicle. Do not drive: If you have been drinking alcohol. Do not ride with someone who has been drinking. When you are tired or distracted. While texting. If you have been using any mind-altering substances or drugs. Wear a helmet and other protective equipment during sports activities. If you have firearms in your house, make sure you follow all gun safety procedures. What's next? Go to your health care provider once a year for an annual wellness visit. Ask your health care provider how often you should have your eyes and teeth checked. Stay up to date on all vaccines. This information is not intended to replace advice given to you by your health care provider. Make sure you discuss any questions you have with your health care provider. Document Revised: 10/12/2020 Document Reviewed: 10/12/2020 Elsevier Patient  Education  [place].

## 2022-02-26 NOTE — Assessment & Plan Note (Signed)
Symptoms are well controlled.  We are taking over his prescriptions however does not need refill today.  Continue Keppra 1500 mg twice daily and zonisamide 200 mg twice daily.  He will let us know needs a refill.

## 2022-02-26 NOTE — Assessment & Plan Note (Signed)
He is working on diet and exercise.  We will check lipids.

## 2022-02-26 NOTE — Progress Notes (Signed)
Chief Complaint:  Glenn Rosario is a 41 y.o. male who presents today for his annual comprehensive physical exam.    Assessment/Plan:  New/Acute Problems: Nephrolithiasis Symptoms have resolved.  Encouraged good hydration.  Chronic Problems Addressed Today: Generalized convulsive epilepsy Symptoms are well controlled.  We are taking over his prescriptions however does not need refill today.  Continue Keppra 1500 mg twice daily and zonisamide 200 mg twice daily.  He will let us know needs a refill.  Dyslipidemia He is working on diet and exercise.  We will check lipids.   Gout No recent flares.  Check uric acid.  He is on allopurinol 300 mg daily.  Preventative Healthcare: Check labs.  Flu shot given today.  Patient Counseling(The following topics were reviewed and/or handout was given):  -Nutrition: Stressed importance of moderation in sodium/caffeine intake, saturated fat and cholesterol, caloric balance, sufficient intake of fresh fruits, vegetables, and fiber.  -Stressed the importance of regular exercise.   -Substance Abuse: Discussed cessation/primary prevention of tobacco, alcohol, or other drug use; driving or other dangerous activities under the influence; availability of treatment for abuse.   -Injury prevention: Discussed safety belts, safety helmets, smoke detector, smoking near bedding or upholstery.   -Sexuality: Discussed sexually transmitted diseases, partner selection, use of condoms, avoidance of unintended pregnancy and contraceptive alternatives.   -Dental health: Discussed importance of regular tooth brushing, flossing, and dental visits.  -Health maintenance and immunizations reviewed. Please refer to Health maintenance section.  Return to care in 1 year for next preventative visit.     Subjective:  HPI:  He has no acute complaints today.   He had a kidney stone a couple of months ago while on vacation in [place]. Went to a local ED and was treated. Stone  passed and he has done well since.   Lifestyle Diet: More protein. Cutting down on carbs.  Exercise: Working on [employer]      02/26/2022    7:54 AM  Depression screen PHQ 2/9  Decreased Interest 0  Down, Depressed, Hopeless 0  PHQ - 2 Score 0    Health Maintenance Due  Topic Date Due   INFLUENZA VACCINE  Never done     ROS: Per HPI, otherwise a complete review of systems was negative.   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   Encounter for therapeutic drug monitoring 10/15/2016   Generalized convulsive epilepsy (HCC) 12/22/2012   Essential tremor 12/22/2012   Past Surgical History:  Procedure Laterality Date   None      Family History  Problem Relation Age of Onset   Healthy Mother    Pulmonary Hypertension Mother    Hearing loss Mother    Healthy Father    Heart attack Father    Arthritis Brother    Hearing loss Brother    Hyperlipidemia Brother    Arthritis Brother    Hearing loss Brother     Medications- reviewed and updated Current Outpatient Medications  Medication Sig Dispense Refill   allopurinol (ZYLOPRIM) 300 MG tablet TAKE 1 TABLET BY MOUTH  DAILY 90 tablet 3   Ascorbic  Acid (VITAMIN C) 100 MG tablet Take 100 mg by mouth daily.     Calcium Ascorbate 500 MG TABS Take 500 mg by mouth daily.      Cholecalciferol (D 400 PO) Take 400 mg by mouth daily.     levETIRAcetam (KEPPRA) 750 MG tablet TAKE 2 TABLETS BY MOUTH TWO TIMES DAILY 360 tablet 3   sildenafil (VIAGRA) 100 MG tablet Take 0.5-1 tablets (50-100 mg total) by mouth daily as needed for erectile dysfunction. 15 tablet 3    zonisamide (ZONEGRAN) 100 MG capsule TAKE 2 CAPSULES BY MOUTH  TWO TIMES DAILY 360 capsule 3   No current facility-administered medications for this visit.    Allergies-reviewed and updated Allergies  Allergen Reactions   Depakote [Divalproex Sodium]    Klonopin [Clonazepam]    Nsaids    Tegretol [Carbamazepine]     Social History   Socioeconomic History   Marital status: Single    Spouse name: Not on file   Number of children: 0   Years of education: 12+   Highest education level: Not on file  Occupational History    Employer: [employer]  Tobacco Use   Smoking status: Never   Smokeless tobacco: Never  Vaping Use   Vaping Use: Never used  Substance and Sexual Activity   Alcohol use: Yes    Alcohol/week: 5.0 standard drinks of alcohol    Types: 5 Standard drinks or equivalent per week   Drug use: No   Sexual activity: Yes  Other Topics Concern   Not on file  Social History Narrative   Patient is single and lives alone.    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    Patient drinks some caffeine.       Social Determinants of Health   Financial Resource Strain: Not on file  Food Insecurity: Not on file  Transportation Needs: Not on file  Physical Activity: Not on file  Stress: Not on file  Social Connections: Not on file        Objective:  Physical Exam: BP 113/76   Pulse 71   Temp 97.8 F (36.6 C) (Temporal)   Ht 5\' 11"  (1.803 m)   Wt 212 lb 9.6 oz (96.4 kg)   SpO2 99%   BMI 29.65 kg/m   Body mass index is 29.65 kg/m. Wt Readings from Last 3 Encounters:  02/26/22 212 lb 9.6 oz (96.4 kg)  09/21/21 211 lb 3.2 oz (95.8 kg)  09/20/21 212 lb 8 oz (96.4 kg)   Gen: NAD, resting comfortably HEENT: TMs normal bilaterally. OP clear. No thyromegaly noted.  CV: RRR with no murmurs appreciated Pulm: NWOB, CTAB with no crackles, wheezes, or rhonchi GI: Normal bowel sounds present. Soft, Nontender, Nondistended. MSK: no edema, cyanosis, or clubbing  noted Skin: warm, dry Neuro: CN2-12 grossly intact. Strength 5/5 in upper and lower extremities. Reflexes symmetric and intact bilaterally.  Psych: Normal affect and thought content     Molley Houser M. Jerline Pain, MD 02/26/2022 8:13 AM

## 2022-03-06 ENCOUNTER — Telehealth: Payer: Self-pay | Admitting: Family Medicine

## 2022-03-06 NOTE — Telephone Encounter (Signed)
..  Type of form received: Physician Results Form  Additional comments:   Received by: Adonis Brook  Form should be Faxed to: [phone]  Form should be mailed to:    Is patient requesting call for pickup:   Form placed:  In provider's box  Attach charge sheet. yes  Individual made aware of 3-5 business day turn around (Y/N)?  yes

## 2022-03-07 ENCOUNTER — Other Ambulatory Visit: Payer: Self-pay | Admitting: *Deleted

## 2022-03-07 NOTE — Telephone Encounter (Signed)
Pt signed form. Faxed to [phone]. Copy sent to be scanned into chart.

## 2022-03-07 NOTE — Telephone Encounter (Signed)
Patient notified form ready. Unable to fax it Need patient signature prior to fax form  Patient stated will came in some time today  Form placed at front office

## 2022-04-18 ENCOUNTER — Other Ambulatory Visit: Payer: Self-pay | Admitting: Family Medicine

## 2022-07-21 ENCOUNTER — Other Ambulatory Visit: Payer: Self-pay | Admitting: Family Medicine

## 2022-09-26 ENCOUNTER — Other Ambulatory Visit: Payer: Self-pay | Admitting: *Deleted

## 2022-09-26 DIAGNOSIS — G40309 Generalized idiopathic epilepsy and epileptic syndromes, not intractable, without status epilepticus: Secondary | ICD-10-CM

## 2022-09-26 MED ORDER — LEVETIRACETAM 750 MG PO TABS: ORAL_TABLET | ORAL | 0 refills | Status: DC

## 2022-09-26 NOTE — Telephone Encounter (Signed)
Pt last seen on 09/20/21 No follow up scheduled Last filled on 09/20/21 #360 tablets (90 day supply)

## 2022-09-27 ENCOUNTER — Ambulatory Visit: Payer: 59 | Admitting: Family Medicine

## 2022-10-10 ENCOUNTER — Telehealth: Payer: Self-pay | Admitting: Family Medicine

## 2022-10-10 DIAGNOSIS — G40309 Generalized idiopathic epilepsy and epileptic syndromes, not intractable, without status epilepticus: Secondary | ICD-10-CM

## 2022-10-10 MED ORDER — ZONISAMIDE 100 MG PO CAPS: ORAL_CAPSULE | ORAL | 1 refills | Status: DC

## 2022-10-10 NOTE — Telephone Encounter (Signed)
Pt has called back to confirm that yes he will see his PCP in Oct, and he would like Amy, NP to refill his medication until then.  No call back requested by pt.

## 2022-10-10 NOTE — Telephone Encounter (Signed)
LVM for pt to call office. Please relay AL,NP message below if he calls and let us know what he says. Thank you

## 2022-10-10 NOTE — Telephone Encounter (Signed)
Amy, I reviewed his chart. You last saw him on 09/20/21 for epilepsy f/u. Takes Zonegran 100mg , 2 caps po BID and Keppra 750mg  2 po BID. Does not have current f/u. Appears he cx yearly f/u 09/27/22 reporting he did not need this.  How do you want to proceed?

## 2022-10-10 NOTE — Telephone Encounter (Signed)
E-scribed rx to [facility]. 

## 2022-10-10 NOTE — Addendum Note (Signed)
Addended by: Arther Abbott on: 10/10/2022 03:53 PM   Modules accepted: Orders

## 2022-10-10 NOTE — Telephone Encounter (Signed)
Pt has been told that he needs to seek refills from his PCP for his zonisamide (ZONEGRAN) 100 MG capsule  but he will not see his PCP before Oct for annual visit.  Pt is asking if since his levETIRAcetam (KEPPRA) 750 MG tablet  was done can he at least get a script to  [facility]    until the month of Oct when he see's his PCP

## 2022-11-07 DIAGNOSIS — Z0279 Encounter for issue of other medical certificate: Secondary | ICD-10-CM

## 2022-12-12 ENCOUNTER — Telehealth: Payer: Self-pay | Admitting: Family Medicine

## 2022-12-12 NOTE — Telephone Encounter (Signed)
Patient dropped off document FMLA, to be filled out by provider. Patient requested to send it back via Fax [phone] 7-days. Document is located in providers tray at front office.Please advise at Mobile [phone] (mobile)

## 2022-12-13 NOTE — Telephone Encounter (Signed)
Placed in PCP office to be reviewed  

## 2022-12-21 NOTE — Telephone Encounter (Signed)
FMLA form faxed to [phone] Copy mail to patient  Form placed to be scan in patient chart

## 2023-02-05 ENCOUNTER — Telehealth: Payer: Self-pay | Admitting: Family Medicine

## 2023-02-05 DIAGNOSIS — G40309 Generalized idiopathic epilepsy and epileptic syndromes, not intractable, without status epilepticus: Secondary | ICD-10-CM

## 2023-02-05 MED ORDER — LEVETIRACETAM 750 MG PO TABS: ORAL_TABLET | ORAL | 0 refills | Status: DC

## 2023-02-05 NOTE — Telephone Encounter (Signed)
Patient forgot meds-currently in [place]-Prescribed by another Provider-needs asap. Please advise.   Prescription Request  02/05/2023  LOV: 02/26/2022  What is the name of the medication or equipment? levETIRAcetam (KEPPRA) 750 MG tablet   Have you contacted your pharmacy to request a refill? Yes   Which pharmacy would you like this sent to?  [facility] located at [address] [place], [place] [address]   Patient notified that their request is being sent to the clinical staff for review and that they should receive a response within 2 business days.   Please advise at Mobile [phone] (mobile)
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
## 2023-02-05 NOTE — Telephone Encounter (Signed)
Please disregard previous message. States prescribing Provider ([facility]) sending RX

## 2023-02-05 NOTE — Telephone Encounter (Signed)
Last seen 09/20/21. Dx: Seizure. Spoke with Amy Lomax,NP. She approved refilling a 30 days supply and he must get future refills from PCP after that. I called pt. Relayed message. E-scribed rx. He verbalized understanding and appreciation.

## 2023-02-06 NOTE — Telephone Encounter (Signed)
Noted. Thanks!  Josie Saunders RMA

## 2023-03-01 ENCOUNTER — Encounter: Payer: 59 | Admitting: Family Medicine

## 2023-04-02 ENCOUNTER — Other Ambulatory Visit: Payer: Self-pay | Admitting: Family Medicine

## 2023-05-20 ENCOUNTER — Encounter: Payer: 59 | Admitting: Family Medicine

## 2023-05-22 ENCOUNTER — Ambulatory Visit: Payer: 59 | Admitting: Family Medicine

## 2023-05-22 ENCOUNTER — Encounter: Payer: Self-pay | Admitting: Family Medicine

## 2023-05-22 VITALS — BP 119/84 | HR 91 | Temp 98.0°F | Ht 71.0 in | Wt 209.0 lb

## 2023-05-22 DIAGNOSIS — Z0001 Encounter for general adult medical examination with abnormal findings: Secondary | ICD-10-CM

## 2023-05-22 DIAGNOSIS — G40309 Generalized idiopathic epilepsy and epileptic syndromes, not intractable, without status epilepticus: Secondary | ICD-10-CM

## 2023-05-22 DIAGNOSIS — M109 Gout, unspecified: Secondary | ICD-10-CM

## 2023-05-22 DIAGNOSIS — Z131 Encounter for screening for diabetes mellitus: Secondary | ICD-10-CM

## 2023-05-22 DIAGNOSIS — E785 Hyperlipidemia, unspecified: Secondary | ICD-10-CM | POA: Diagnosis not present

## 2023-05-22 DIAGNOSIS — R7989 Other specified abnormal findings of blood chemistry: Secondary | ICD-10-CM

## 2023-05-22 DIAGNOSIS — N529 Male erectile dysfunction, unspecified: Secondary | ICD-10-CM

## 2023-05-22 LAB — LIPID PANEL
Cholesterol: 209 mg/dL — ABNORMAL HIGH (ref 0–200)
HDL: 56.3 mg/dL (ref >39.00)
LDL Cholesterol: 126 mg/dL — ABNORMAL HIGH (ref 0–99)
NonHDL: 152.37
Total CHOL/HDL Ratio: 4
Triglycerides: 133 mg/dL (ref 0.0–149.0)
VLDL: 26.6 mg/dL (ref 0.0–40.0)

## 2023-05-22 LAB — COMPREHENSIVE METABOLIC PANEL
ALT: 20 U/L (ref 0–53)
AST: 19 U/L (ref 0–37)
Albumin: 4.6 g/dL (ref 3.5–5.2)
Alkaline Phosphatase: 60 U/L (ref 39–117)
BUN: 15 mg/dL (ref 6–23)
CO2: 26 meq/L (ref 19–32)
Calcium: 9.3 mg/dL (ref 8.4–10.5)
Chloride: 106 meq/L (ref 96–112)
Creatinine, Ser: 1.22 mg/dL (ref 0.40–1.50)
GFR: 72.15 mL/min (ref >60.00)
Glucose, Bld: 103 mg/dL — ABNORMAL HIGH (ref 70–99)
Potassium: 3.8 meq/L (ref 3.5–5.1)
Sodium: 140 meq/L (ref 135–145)
Total Bilirubin: 0.7 mg/dL (ref 0.2–1.2)
Total Protein: 7.2 g/dL (ref 6.0–8.3)

## 2023-05-22 LAB — URIC ACID: Uric Acid, Serum: 4.7 mg/dL (ref 4.0–7.8)

## 2023-05-22 LAB — CBC
HCT: 48.2 % (ref 39.0–52.0)
Hemoglobin: 16 g/dL (ref 13.0–17.0)
MCHC: 33.2 g/dL (ref 30.0–36.0)
MCV: 94.5 fL (ref 78.0–100.0)
Platelets: 175 10*3/uL (ref 150.0–400.0)
RBC: 5.1 Mil/uL (ref 4.22–5.81)
RDW: 12.4 % (ref 11.5–15.5)
WBC: 5.3 10*3/uL (ref 4.0–10.5)

## 2023-05-22 LAB — TSH: TSH: 2.19 u[IU]/mL (ref 0.35–5.50)

## 2023-05-22 LAB — HEMOGLOBIN A1C: Hgb A1c MFr Bld: 5.5 % (ref 4.6–6.5)

## 2023-05-22 MED ORDER — LEVETIRACETAM 750 MG PO TABS: 750.0000 mg | ORAL_TABLET | Freq: Two times a day (BID) | ORAL | 3 refills | Status: DC

## 2023-05-22 MED ORDER — ZONISAMIDE 100 MG PO CAPS: 200.0000 mg | ORAL_CAPSULE | Freq: Two times a day (BID) | ORAL | 3 refills | Status: DC

## 2023-05-22 NOTE — Assessment & Plan Note (Signed)
Check labs 

## 2023-05-22 NOTE — Assessment & Plan Note (Signed)
Stable on Viagra as needed.  Does not need refill today.

## 2023-05-22 NOTE — Patient Instructions (Signed)
It was very nice to see you today!  I will refill your medications.  Please keep up the great work with your diet and exercise.  We will check blood work today.  I will see back in a year.  Come back sooner if needed.  Return in about 1 year (around 05/21/2024) for Annual Physical.   Take care, Dr Jimmey Ralph  PLEASE NOTE:  If you had any lab tests, please let us know if you have not heard back within a few days. You may see your results on mychart before we have a chance to review them but we will give you a call once they are reviewed by [place].   If we ordered any referrals today, please let us know if you have not heard from their office within the next week.   If you had any urgent prescriptions sent in today, please check with the pharmacy within an hour of our visit to make sure the prescription was transmitted appropriately.   Please try these tips to maintain a healthy lifestyle:  Eat at least 3 REAL meals and 1-2 snacks per day.  Aim for no more than 5 hours between eating.  If you eat breakfast, please do so within one hour of getting up.   Each meal should contain half fruits/vegetables, one quarter protein, and one quarter carbs (no bigger than a computer mouse)  Cut down on sweet beverages. This includes juice, soda, and sweet tea.   Drink at least 1 glass of water with each meal and aim for at least 8 glasses per day  Exercise at least 150 minutes every week.    Preventive Care 39-27 Years Old, Male Preventive care refers to lifestyle choices and visits with your health care provider that can promote health and wellness. Preventive care visits are also called wellness exams. What can I expect for my preventive care visit? Counseling During your preventive care visit, your health care provider may ask about your: Medical history, including: Past medical problems. Family medical history. Current health, including: Emotional well-being. Home life and relationship  well-being. Sexual activity. Lifestyle, including: Alcohol, nicotine or tobacco, and drug use. Access to firearms. Diet, exercise, and sleep habits. Safety issues such as seatbelt and bike helmet use. Sunscreen use. Work and work Astronomer. Physical exam Your health care provider will check your: Height and weight. These may be used to calculate your BMI (body mass index). BMI is a measurement that tells if you are at a healthy weight. Waist circumference. This measures the distance around your waistline. This measurement also tells if you are at a healthy weight and may help predict your risk of certain diseases, such as type 2 diabetes and high blood pressure. Heart rate and blood pressure. Body temperature. Skin for abnormal spots. What immunizations do I need?  Vaccines are usually given at various ages, according to a schedule. Your health care provider will recommend vaccines for you based on your age, medical history, and lifestyle or other factors, such as travel or where you work. What tests do I need? Screening Your health care provider may recommend screening tests for certain conditions. This may include: Lipid and cholesterol levels. Diabetes screening. This is done by checking your blood sugar (glucose) after you have not eaten for a while (fasting). Hepatitis B test. Hepatitis C test. HIV (human immunodeficiency virus) test. STI (sexually transmitted infection) testing, if you are at risk. Lung cancer screening. Prostate cancer screening. Colorectal cancer screening. Talk with your health  care provider about your test results, treatment options, and if necessary, the need for more tests. Follow these instructions at home: Eating and drinking  Eat a diet that includes fresh fruits and vegetables, whole grains, lean protein, and low-fat dairy products. Take vitamin and mineral supplements as recommended by your health care provider. Do not drink alcohol if your  health care provider tells you not to drink. If you drink alcohol: Limit how much you have to 0-2 drinks a day. Know how much alcohol is in your drink. In the [place] one drink equals one 12 oz bottle of beer (355 mL), one 5 oz glass of wine (148 mL), or one 1 oz glass of hard liquor (44 mL). Lifestyle Brush your teeth every morning and night with fluoride toothpaste. Floss one time each day. Exercise for at least 30 minutes 5 or more days each week. Do not use any products that contain nicotine or tobacco. These products include cigarettes, chewing tobacco, and vaping devices, such as e-cigarettes. If you need help quitting, ask your health care provider. Do not use drugs. If you are sexually active, practice safe sex. Use a condom or other form of protection to prevent STIs. Take aspirin only as told by your health care provider. Make sure that you understand how much to take and what form to take. Work with your health care provider to find out whether it is safe and beneficial for you to take aspirin daily. Find healthy ways to manage stress, such as: Meditation, yoga, or listening to music. Journaling. Talking to a trusted person. Spending time with friends and family. Minimize exposure to UV radiation to reduce your risk of skin cancer. Safety Always wear your seat belt while driving or riding in a vehicle. Do not drive: If you have been drinking alcohol. Do not ride with someone who has been drinking. When you are tired or distracted. While texting. If you have been using any mind-altering substances or drugs. Wear a helmet and other protective equipment during sports activities. If you have firearms in your house, make sure you follow all gun safety procedures. What's next? Go to your health care provider once a year for an annual wellness visit. Ask your health care provider how often you should have your eyes and teeth checked. Stay up to date on all vaccines. This information  is not intended to replace advice given to you by your health care provider. Make sure you discuss any questions you have with your health care provider. Document Revised: 10/12/2020 Document Reviewed: 10/12/2020 Elsevier Patient Education  2024 [employer].

## 2023-05-22 NOTE — Assessment & Plan Note (Signed)
Symptoms are very well-controlled.  We are taking over prescriptions.  We will continue Keppra 1500mg  twice daily and zonisamide 200 mg twice daily.

## 2023-05-22 NOTE — Assessment & Plan Note (Signed)
Stable.  He is working on diet and exercise.  Check labs today.

## 2023-05-22 NOTE — Progress Notes (Signed)
Chief Complaint:  Glenn Rosario is a 43 y.o. male who presents today for his annual comprehensive physical exam.    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  We are taking over prescriptions.  We will continue Keppra 1500mg  twice daily and zonisamide 200 mg twice daily.  Elevated serum creatinine Check labs.  Gout Stable.  No recent flares.  Check uric acid.  Continue allopurinol 300 mg daily.  Dyslipidemia Stable.  He is working on diet and exercise.  Check labs today.  Erectile dysfunction Stable on Viagra as needed.  Does not need refill today.  Preventative Healthcare: Check labs.  Flu vaccine declined.  Due for colon cancer screening next year.  Patient Counseling(The following topics were reviewed and/or handout was given):  -Nutrition: Stressed importance of moderation in sodium/caffeine intake, saturated fat and cholesterol, caloric balance, sufficient intake of fresh fruits, vegetables, and fiber.  -Stressed the importance of regular exercise.   -Substance Abuse: Discussed cessation/primary prevention of tobacco, alcohol, or other drug use; driving or other dangerous activities under the influence; availability of treatment for abuse.   -Injury prevention: Discussed safety belts, safety helmets, smoke detector, smoking near bedding or upholstery.   -Sexuality: Discussed sexually transmitted diseases, partner selection, use of condoms, avoidance of unintended pregnancy and contraceptive alternatives.   -Dental health: Discussed importance of regular tooth brushing, flossing, and dental visits.  -Health maintenance and immunizations reviewed. Please refer to Health maintenance section.  Return to care in 1 year for next preventative visit.     Subjective:  HPI:  He has no acute complaints today. See Assessment / plan for status of chronic conditions.   Lifestyle Diet: Balanced. Plenty of  fruits and vegetables.  Exercise: routinely. Weight training and cardio.     05/22/2023    8:02 AM  Depression screen PHQ 2/9  Decreased Interest 0  Down, Depressed, Hopeless 0  PHQ - 2 Score 0    Health Maintenance Due  Topic Date Due   COVID-19 Vaccine (1) Never done     ROS: Per HPI, otherwise a complete review of systems was negative.   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   Encounter for therapeutic drug monitoring 10/15/2016   Generalized convulsive epilepsy (HCC) 12/22/2012   Essential tremor 12/22/2012   Past Surgical History:  Procedure Laterality Date   None      Family History  Problem Relation Age of Onset   Healthy Mother    Pulmonary Hypertension Mother    Hearing loss Mother    Healthy Father    Heart attack Father    Arthritis Brother    Hearing loss Brother    Hyperlipidemia Brother    Arthritis Brother    Hearing loss Brother     Medications- reviewed and updated Current Outpatient Medications  Medication Sig Dispense Refill   allopurinol (ZYLOPRIM) 300 MG tablet TAKE 1 TABLET BY MOUTH DAILY 90 tablet 0   Ascorbic Acid (VITAMIN C) 100 MG tablet Take 100 mg by mouth daily.     Calcium Ascorbate 500 MG  TABS Take 500 mg by mouth daily.      Cholecalciferol (D 400 PO) Take 400 mg by mouth daily.     sildenafil (VIAGRA) 100 MG tablet TAKE 1/2 TO 1 TABLET BY  MOUTH DAILY AS NEEDED FOR  ERECTILE DYSFUNCTION 18 tablet 1   levETIRAcetam (KEPPRA) 750 MG tablet Take 1 tablet (750 mg total) by mouth 2 (two) times daily. 180 tablet 3   zonisamide (ZONEGRAN) 100 MG capsule Take  2 capsules (200 mg total) by mouth 2 (two) times daily. 360 capsule 3   No current facility-administered medications for this visit.    Allergies-reviewed and updated Allergies  Allergen Reactions   Depakote [Divalproex Sodium]    Klonopin [Clonazepam]    Nsaids    Tegretol [Carbamazepine]     Social History   Socioeconomic History   Marital status: Single    Spouse name: Not on file   Number of children: 0   Years of education: 12+   Highest education level: Bachelor's degree (e.g., BA, AB, BS)  Occupational History    Employer: [employer]  Tobacco Use   Smoking status: Never   Smokeless tobacco: Never  Vaping Use   Vaping status: Never Used  Substance and Sexual Activity   Alcohol use: Yes    Alcohol/week: 5.0 standard drinks of alcohol    Types: 5 Standard drinks or equivalent per week   Drug use: No   Sexual activity: Yes  Other Topics Concern   Not on file  Social History Narrative   Patient is single and lives alone.    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    Patient drinks some caffeine.       Social Drivers of Corporate investment banker Strain: Low Risk  (05/18/2023)   Overall Financial Resource Strain (CARDIA)    Difficulty of Paying Living Expenses: Not hard at all  Food Insecurity: No Food Insecurity (05/18/2023)   Hunger Vital Sign    Worried About Running Out of Food in the Last Year: Never true    Ran Out of Food in the Last Year: Never true  Transportation Needs: No Transportation Needs (05/18/2023)   PRAPARE - Administrator, Civil Service (Medical): No    Lack of Transportation (Non-Medical): No  Physical Activity: Sufficiently Active (05/18/2023)   Exercise Vital Sign    Days of Exercise per Week: 5 days    Minutes of Exercise per Session: 30 min  Stress: No Stress Concern Present (05/18/2023)   [employer] of Occupational Health - Occupational Stress Questionnaire    Feeling of Stress : Only a little  Social  Connections: Unknown (05/18/2023)   Social Connection and Isolation Panel [NHANES]    Frequency of Communication with Friends and Family: More than three times a week    Frequency of Social Gatherings with Friends and Family: Once a week    Attends Religious Services: Patient declined    Database administrator or Organizations: No    Attends Engineer, structural: Not on file    Marital Status: Never married        Objective:  Physical Exam: BP 119/84   Pulse 91   Temp 98 F (36.7 C) (Temporal)   Ht 5\' 11"  (1.803 m)   Wt 209 lb (94.8 kg)   SpO2 99%   BMI 29.15 kg/m   Body mass index is 29.15 kg/m. Wt Readings from Last 3 Encounters:  05/22/23 209 lb (94.8  kg)  02/26/22 212 lb 9.6 oz (96.4 kg)  09/21/21 211 lb 3.2 oz (95.8 kg)   Gen: NAD, resting comfortably HEENT: TMs normal bilaterally. OP clear. No thyromegaly noted.  CV: RRR with no murmurs appreciated Pulm: NWOB, CTAB with no crackles, wheezes, or rhonchi GI: Normal bowel sounds present. Soft, Nontender, Nondistended. MSK: no edema, cyanosis, or clubbing noted Skin: warm, dry Neuro: CN2-12 grossly intact. Strength 5/5 in upper and lower extremities. Reflexes symmetric and intact bilaterally.  Psych: Normal affect and thought content     Aramis Weil M. Jimmey Ralph, MD 05/22/2023 8:43 AM

## 2023-05-22 NOTE — Assessment & Plan Note (Signed)
Stable.  No recent flares.  Check uric acid.  Continue allopurinol 300 mg daily.

## 2023-05-24 ENCOUNTER — Encounter: Payer: Self-pay | Admitting: Family Medicine

## 2023-05-24 NOTE — Progress Notes (Signed)
His cholesterol is up a little bit but stable compared to previous.  Do not need to start meds.  He should work on diet and exercise.  The rest of his labs are all at goal.  We can recheck everything in a year.

## 2023-06-29 ENCOUNTER — Other Ambulatory Visit: Payer: Self-pay | Admitting: Family Medicine

## 2024-05-22 ENCOUNTER — Encounter: Payer: Self-pay | Admitting: Family Medicine

## 2024-05-22 ENCOUNTER — Ambulatory Visit (INDEPENDENT_AMBULATORY_CARE_PROVIDER_SITE_OTHER): Payer: 59 | Admitting: Family Medicine

## 2024-05-22 DIAGNOSIS — Z0001 Encounter for general adult medical examination with abnormal findings: Secondary | ICD-10-CM

## 2024-05-22 DIAGNOSIS — G40309 Generalized idiopathic epilepsy and epileptic syndromes, not intractable, without status epilepticus: Secondary | ICD-10-CM

## 2024-05-22 DIAGNOSIS — E785 Hyperlipidemia, unspecified: Secondary | ICD-10-CM | POA: Diagnosis not present

## 2024-05-22 DIAGNOSIS — Z131 Encounter for screening for diabetes mellitus: Secondary | ICD-10-CM | POA: Diagnosis not present

## 2024-05-22 DIAGNOSIS — M109 Gout, unspecified: Secondary | ICD-10-CM

## 2024-05-22 LAB — COMPREHENSIVE METABOLIC PANEL WITH GFR
ALT: 30 U/L (ref 3–53)
AST: 25 U/L (ref 5–37)
Albumin: 4.6 g/dL (ref 3.5–5.2)
Alkaline Phosphatase: 70 U/L (ref 39–117)
BUN: 14 mg/dL (ref 6–23)
CO2: 25 meq/L (ref 19–32)
Calcium: 9.6 mg/dL (ref 8.4–10.5)
Chloride: 106 meq/L (ref 96–112)
Creatinine, Ser: 1.24 mg/dL (ref 0.40–1.50)
GFR: 70.26 mL/min
Glucose, Bld: 101 mg/dL — ABNORMAL HIGH (ref 70–99)
Potassium: 4 meq/L (ref 3.5–5.1)
Sodium: 137 meq/L (ref 135–145)
Total Bilirubin: 1 mg/dL (ref 0.2–1.2)
Total Protein: 7.4 g/dL (ref 6.0–8.3)

## 2024-05-22 LAB — TSH: TSH: 1.47 u[IU]/mL (ref 0.35–5.50)

## 2024-05-22 LAB — CBC
HCT: 46.8 % (ref 39.0–52.0)
Hemoglobin: 16 g/dL (ref 13.0–17.0)
MCHC: 34.1 g/dL (ref 30.0–36.0)
MCV: 92.3 fl (ref 78.0–100.0)
Platelets: 173 K/uL (ref 150.0–400.0)
RBC: 5.07 Mil/uL (ref 4.22–5.81)
RDW: 12.6 % (ref 11.5–15.5)
WBC: 4.7 K/uL (ref 4.0–10.5)

## 2024-05-22 LAB — LIPID PANEL
Cholesterol: 183 mg/dL (ref 28–200)
HDL: 44.2 mg/dL
LDL Cholesterol: 116 mg/dL — ABNORMAL HIGH (ref 10–99)
NonHDL: 138.57
Total CHOL/HDL Ratio: 4
Triglycerides: 111 mg/dL (ref 10.0–149.0)
VLDL: 22.2 mg/dL (ref 0.0–40.0)

## 2024-05-22 LAB — HEMOGLOBIN A1C: Hgb A1c MFr Bld: 5.4 % (ref 4.6–6.5)

## 2024-05-22 LAB — URIC ACID: Uric Acid, Serum: 5 mg/dL (ref 4.0–7.8)

## 2024-05-22 MED ORDER — ALLOPURINOL 300 MG PO TABS: 300.0000 mg | ORAL_TABLET | Freq: Every day | ORAL | 3 refills | Status: AC

## 2024-05-22 MED ORDER — ZONISAMIDE 100 MG PO CAPS: 200.0000 mg | ORAL_CAPSULE | Freq: Two times a day (BID) | ORAL | 3 refills | Status: AC

## 2024-05-22 MED ORDER — LEVETIRACETAM 750 MG PO TABS: 750.0000 mg | ORAL_TABLET | Freq: Two times a day (BID) | ORAL | 3 refills | Status: AC

## 2024-05-22 NOTE — Patient Instructions (Signed)
 It was very nice to see you today!  VISIT SUMMARY: Today, you had your annual physical exam. You are in good health with no new symptoms or issues.  YOUR PLAN: ANNUAL PREVENTIVE HEALTH EVALUATION: Routine evaluation revealed no new issues. Colon cancer screening was discussed and deferred until age 44. -Report any red flags such as persistent blood in stool or significant changes in stool color. -Blood work was ordered, including A1c, uric acid, thyroid  function, blood counts, kidney function, and liver function tests.  GOUT: Management is ongoing with no new issues. -Continue current management.  SCREENING FOR DIABETES MELLITUS: Routine screening was conducted as part of the annual evaluation. -An A1c test was ordered as part of the blood work.  Return in about 1 year (around 05/22/2025) for Annual Physical.   Take care, Dr Kennyth  PLEASE NOTE:  If you had any lab tests, please let us  know if you have not heard back within a few days. You may see your results on mychart before we have a chance to review them but we will give you a call once they are reviewed by us .   If we ordered any referrals today, please let us  know if you have not heard from their office within the next week.   If you had any urgent prescriptions sent in today, please check with the pharmacy within an hour of our visit to make sure the prescription was transmitted appropriately.   Please try these tips to maintain a healthy lifestyle:  Eat at least 3 REAL meals and 1-2 snacks per day.  Aim for no more than 5 hours between eating.  If you eat breakfast, please do so within one hour of getting up.   Each meal should contain half fruits/vegetables, one quarter protein, and one quarter carbs (no bigger than a computer mouse)  Cut down on sweet beverages. This includes juice, soda, and sweet tea.   Drink at least 1 glass of water with each meal and aim for at least 8 glasses per day  Exercise at least 150  minutes every week.    Preventive Care 92-33 Years Old, Male Preventive care refers to lifestyle choices and visits with your health care provider that can promote health and wellness. Preventive care visits are also called wellness exams. What can I expect for my preventive care visit? Counseling During your preventive care visit, your health care provider may ask about your: Medical history, including: Past medical problems. Family medical history. Current health, including: Emotional well-being. Home life and relationship well-being. Sexual activity. Lifestyle, including: Alcohol, nicotine or tobacco, and drug use. Access to firearms. Diet, exercise, and sleep habits. Safety issues such as seatbelt and bike helmet use. Sunscreen use. Work and work astronomer. Physical exam Your health care provider will check your: Height and weight. These may be used to calculate your BMI (body mass index). BMI is a measurement that tells if you are at a healthy weight. Waist circumference. This measures the distance around your waistline. This measurement also tells if you are at a healthy weight and may help predict your risk of certain diseases, such as type 2 diabetes and high blood pressure. Heart rate and blood pressure. Body temperature. Skin for abnormal spots. What immunizations do I need?  Vaccines are usually given at various ages, according to a schedule. Your health care provider will recommend vaccines for you based on your age, medical history, and lifestyle or other factors, such as travel or where you work. What  tests do I need? Screening Your health care provider may recommend screening tests for certain conditions. This may include: Lipid and cholesterol levels. Diabetes screening. This is done by checking your blood sugar (glucose) after you have not eaten for a while (fasting). Hepatitis B test. Hepatitis C test. HIV (human immunodeficiency virus) test. STI (sexually  transmitted infection) testing, if you are at risk. Lung cancer screening. Prostate cancer screening. Colorectal cancer screening. Talk with your health care provider about your test results, treatment options, and if necessary, the need for more tests. Follow these instructions at home: Eating and drinking  Eat a diet that includes fresh fruits and vegetables, whole grains, lean protein, and low-fat dairy products. Take vitamin and mineral supplements as recommended by your health care provider. Do not drink alcohol if your health care provider tells you not to drink. If you drink alcohol: Limit how much you have to 0-2 drinks a day. Know how much alcohol is in your drink. In the [place] one drink equals one 12 oz bottle of beer (355 mL), one 5 oz glass of wine (148 mL), or one 1 oz glass of hard liquor (44 mL). Lifestyle Brush your teeth every morning and night with fluoride toothpaste. Floss one time each day. Exercise for at least 30 minutes 5 or more days each week. Do not use any products that contain nicotine or tobacco. These products include cigarettes, chewing tobacco, and vaping devices, such as e-cigarettes. If you need help quitting, ask your health care provider. Do not use drugs. If you are sexually active, practice safe sex. Use a condom or other form of protection to prevent STIs. Take aspirin only as told by your health care provider. Make sure that you understand how much to take and what form to take. Work with your health care provider to find out whether it is safe and beneficial for you to take aspirin daily. Find healthy ways to manage stress, such as: Meditation, yoga, or listening to music. Journaling. Talking to a trusted person. Spending time with friends and family. Minimize exposure to UV radiation to reduce your risk of skin cancer. Safety Always wear your seat belt while driving or riding in a vehicle. Do not drive: If you have been drinking alcohol. Do  not ride with someone who has been drinking. When you are tired or distracted. While texting. If you have been using any mind-altering substances or drugs. Wear a helmet and other protective equipment during sports activities. If you have firearms in your house, make sure you follow all gun safety procedures. What's next? Go to your health care provider once a year for an annual wellness visit. Ask your health care provider how often you should have your eyes and teeth checked. Stay up to date on all vaccines. This information is not intended to replace advice given to you by your health care provider. Make sure you discuss any questions you have with your health care provider. Document Revised: 10/12/2020 Document Reviewed: 10/12/2020 Elsevier Patient Education  2024 [employer].

## 2024-05-22 NOTE — Assessment & Plan Note (Signed)
 Check bids.  Discussed lifestyle modifications.

## 2024-05-22 NOTE — Progress Notes (Signed)
 "  Chief Complaint:  Glenn Rosario is a 44 y.o. male who presents today for his annual comprehensive physical exam.    Assessment/Plan:  Chronic Problems Addressed Today: Generalized convulsive epilepsy (HCC) Symptoms very well-controlled with Keppra  1500 mg twice daily and zonisamide  200 mg twice daily.  No recent seizures.  Will refill today.  Gout Check uric acid level.  No recent flares.  He is on allopurinol  300 mg daily.  Dyslipidemia Check bids.  Discussed lifestyle modifications.  Preventative Healthcare: Flu vaccine declined.  Due for colon cancer screening.  We did discuss colonoscopy versus Cologuard however he would like to wait until he is 13.  We discussed again next year.  He will let us  know if he changes his mind.  Patient Counseling(The following topics were reviewed and/or handout was given):  -Nutrition: Stressed importance of moderation in sodium/caffeine intake, saturated fat and cholesterol, caloric balance, sufficient intake of fresh fruits, vegetables, and fiber.  -Stressed the importance of regular exercise.   -Substance Abuse: Discussed cessation/primary prevention of tobacco, alcohol, or other drug use; driving or other dangerous activities under the influence; availability of treatment for abuse.   -Injury prevention: Discussed safety belts, safety helmets, smoke detector, smoking near bedding or upholstery.   -Sexuality: Discussed sexually transmitted diseases, partner selection, use of condoms, avoidance of unintended pregnancy and contraceptive alternatives.   -Dental health: Discussed importance of regular tooth brushing, flossing, and dental visits.  -Health maintenance and immunizations reviewed. Please refer to Health maintenance section.  Return to care in 1 year for next preventative visit.     Subjective:  HPI:  He has no acute complaints today. Patient is here today for his annual physical.  See assessment / plan for status of chronic  conditions.  Discussed the use of AI scribe software for clinical note transcription with the patient, who gave verbal consent to proceed.  History of Present Illness Glenn Rosario is a 44 year old male who presents for an annual physical exam.   He has no new symptoms or issues since his last visit, including no blood in the stool or changes in stool color.  He maintains a good diet and exercise routine. He works from home and has not been in regular contact with people for the past 15 years, which he believes has kept him from getting sick often.  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      05/22/2024    8:00 AM  Depression screen PHQ 2/9  Decreased Interest 0  Down, Depressed, Hopeless 0  PHQ - 2 Score 0    Health Maintenance Due  Topic Date Due   Colonoscopy  Never done     ROS: Per HPI, otherwise a complete review of systems was negative.   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   Encounter for therapeutic drug monitoring 10/15/2016   Generalized convulsive epilepsy (HCC) 12/22/2012   Essential tremor 12/22/2012   Past Surgical History:  Procedure  Laterality Date   None      Family History  Problem Relation Age of Onset   Healthy Mother    Pulmonary Hypertension Mother    Hearing loss Mother    Healthy Father    Heart attack Father    Arthritis Brother    Hearing loss Brother    Hyperlipidemia Brother    Arthritis Brother     Hearing loss Brother     Medications- reviewed and updated Current Outpatient Medications  Medication Sig Dispense Refill   Ascorbic Acid (VITAMIN C) 100 MG tablet Take 100 mg by mouth daily.     Calcium Ascorbate 500 MG TABS Take 500 mg by mouth daily.      Cholecalciferol (D 400 PO) Take 400 mg by mouth daily.     sildenafil  (VIAGRA ) 100 MG tablet TAKE 1/2 TO 1 TABLET BY  MOUTH DAILY AS NEEDED FOR  ERECTILE DYSFUNCTION 18 tablet 1   allopurinol  (ZYLOPRIM ) 300 MG tablet Take 1 tablet (300 mg total) by mouth daily. 90 tablet 3   levETIRAcetam  (KEPPRA ) 750 MG tablet Take 1 tablet (750 mg total) by mouth 2 (two) times daily. 180 tablet 3   zonisamide  (ZONEGRAN ) 100 MG capsule Take 2 capsules (200 mg total) by mouth 2 (two) times daily. 360 capsule 3   No current facility-administered medications for this visit.    Allergies-reviewed and updated Allergies[1]  Social History   Socioeconomic History   Marital status: Single    Spouse name: Not on file   Number of children: 0   Years of education: 12+   Highest education level: Bachelor's degree (e.g., BA, AB, BS)  Occupational History    Employer: [employer]  Tobacco Use   Smoking status: Never   Smokeless tobacco: Never  Vaping Use   Vaping status: Never Used  Substance and Sexual Activity   Alcohol use: Yes    Alcohol/week: 5.0 standard drinks of alcohol    Types: 5 Standard drinks or equivalent per week   Drug use: No   Sexual activity: Yes  Other Topics Concern   Not on file  Social History Narrative   Patient is single and lives alone.    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    Patient drinks some caffeine.       Social Drivers of Health   Tobacco Use: Low Risk (05/22/2024)   Patient History    Smoking Tobacco Use: Never    Smokeless Tobacco Use: Never    Passive Exposure: Not on file  Financial Resource Strain: Low Risk (05/21/2024)   Overall Financial Resource Strain (CARDIA)    Difficulty of Paying  Living Expenses: Not hard at all  Food Insecurity: No Food Insecurity (05/21/2024)   Epic    Worried About Radiation Protection Practitioner of Food in the Last Year: Never true    Ran Out of Food in the Last Year: Never true  Transportation Needs: No Transportation Needs (05/21/2024)   Epic    Lack of Transportation (Medical): No    Lack of Transportation (Non-Medical): No  Physical Activity: Sufficiently Active (05/21/2024)   Exercise Vital Sign    Days of Exercise per Week: 5 days    Minutes of Exercise per Session: 30 min  Stress: Stress Concern Present (05/21/2024)   [employer] of Occupational Health - Occupational Stress Questionnaire    Feeling of Stress: To some extent  Social Connections: Unknown (05/21/2024)   Social Connection and Isolation Panel    Frequency  of Communication with Friends and Family: More than three times a week    Frequency of Social Gatherings with Friends and Family: Twice a week    Attends Religious Services: Patient declined    Active Member of Clubs or Organizations: No    Attends Engineer, Structural: Not on file    Marital Status: Patient declined  Depression (PHQ2-9): Low Risk (05/22/2024)   Depression (PHQ2-9)    PHQ-2 Score: 0  Alcohol Screen: Low Risk (05/21/2024)   Alcohol Screen    Last Alcohol Screening Score (AUDIT): 3  Housing: Low Risk (05/21/2024)   Epic    Unable to Pay for Housing in the Last Year: No    Number of Times Moved in the Last Year: 0    Homeless in the Last Year: No  Utilities: Not on file  Health Literacy: Not on file        Objective:  Physical Exam: BP 130/82   Pulse 76   Temp 98.2 F (36.8 C) (Temporal)   Ht 5' 11 (1.803 m)   Wt 226 lb 6.4 oz (102.7 kg)   SpO2 96%   BMI 31.58 kg/m   Body mass index is 31.58 kg/m. Wt Readings from Last 3 Encounters:  05/22/24 226 lb 6.4 oz (102.7 kg)  05/22/23 209 lb (94.8 kg)  02/26/22 212 lb 9.6 oz (96.4 kg)   Gen: NAD, resting comfortably HEENT: TMs normal bilaterally. OP  clear. No thyromegaly noted.  CV: RRR with no murmurs appreciated Pulm: NWOB, CTAB with no crackles, wheezes, or rhonchi GI: Normal bowel sounds present. Soft, Nontender, Nondistended. MSK: no edema, cyanosis, or clubbing noted Skin: warm, dry Neuro: CN2-12 grossly intact. Strength 5/5 in upper and lower extremities. Reflexes symmetric and intact bilaterally.  Psych: Normal affect and thought content     Glenn Rosario M. Kennyth, MD 05/22/2024 8:32 AM     [1]  Allergies Allergen Reactions   Depakote [Divalproex Sodium]    Klonopin [Clonazepam]    Nsaids    Tegretol [Carbamazepine]    "

## 2024-05-22 NOTE — Assessment & Plan Note (Signed)
 Symptoms very well-controlled with Keppra  1500 mg twice daily and zonisamide  200 mg twice daily.  No recent seizures.  Will refill today.

## 2024-05-22 NOTE — Assessment & Plan Note (Signed)
 Check uric acid level.  No recent flares.  He is on allopurinol  300 mg daily.

## 2024-05-26 ENCOUNTER — Ambulatory Visit: Payer: Self-pay | Admitting: Family Medicine

## 2024-05-26 NOTE — Progress Notes (Signed)
 LDL is a little elevated but better than last year. All of his other labs are at goal. He should keep up the great work with diet and exercise and we can recheck everything in a year or so.

## 2025-05-24 ENCOUNTER — Encounter: Admitting: Family Medicine
